# Patient Record
Sex: Female | Born: 1965 | Race: White | Hispanic: No | Smoking: Current some day smoker
Health system: Southern US, Community
[De-identification: ages and names within clinical notes are randomized; demographics above are authoritative.]

## PROBLEM LIST (undated history)

## (undated) DIAGNOSIS — F329 Major depressive disorder, single episode, unspecified: Secondary | ICD-10-CM

## (undated) DIAGNOSIS — E079 Disorder of thyroid, unspecified: Secondary | ICD-10-CM

## (undated) DIAGNOSIS — T7840XA Allergy, unspecified, initial encounter: Secondary | ICD-10-CM

## (undated) DIAGNOSIS — K219 Gastro-esophageal reflux disease without esophagitis: Secondary | ICD-10-CM

## (undated) DIAGNOSIS — C439 Malignant melanoma of skin, unspecified: Secondary | ICD-10-CM

## (undated) DIAGNOSIS — I499 Cardiac arrhythmia, unspecified: Secondary | ICD-10-CM

## (undated) DIAGNOSIS — E785 Hyperlipidemia, unspecified: Secondary | ICD-10-CM

## (undated) DIAGNOSIS — R011 Cardiac murmur, unspecified: Secondary | ICD-10-CM

## (undated) DIAGNOSIS — F32A Depression, unspecified: Secondary | ICD-10-CM

## (undated) DIAGNOSIS — C801 Malignant (primary) neoplasm, unspecified: Secondary | ICD-10-CM

## (undated) DIAGNOSIS — G43909 Migraine, unspecified, not intractable, without status migrainosus: Secondary | ICD-10-CM

## (undated) HISTORY — DX: Malignant melanoma of skin, unspecified: C43.9

## (undated) HISTORY — PX: SPLENECTOMY: SUR1306

## (undated) HISTORY — DX: Migraine, unspecified, not intractable, without status migrainosus: G43.909

## (undated) HISTORY — DX: Cardiac arrhythmia, unspecified: I49.9

## (undated) HISTORY — DX: Malignant (primary) neoplasm, unspecified: C80.1

## (undated) HISTORY — PX: CARDIAC ELECTROPHYSIOLOGY STUDY AND ABLATION: SHX1294

## (undated) HISTORY — DX: Gastro-esophageal reflux disease without esophagitis: K21.9

## (undated) HISTORY — DX: Cardiac murmur, unspecified: R01.1

## (undated) HISTORY — PX: BACK SURGERY: SHX140

## (undated) HISTORY — DX: Depression, unspecified: F32.A

## (undated) HISTORY — DX: Allergy, unspecified, initial encounter: T78.40XA

## (undated) HISTORY — DX: Major depressive disorder, single episode, unspecified: F32.9

## (undated) HISTORY — DX: Disorder of thyroid, unspecified: E07.9

## (undated) HISTORY — PX: LUMBAR LAMINECTOMY: SHX95

## (undated) HISTORY — DX: Hyperlipidemia, unspecified: E78.5

---
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
## 1995-03-03 HISTORY — PX: CERVICAL BIOPSY  W/ LOOP ELECTRODE EXCISION: SUR135

## 2001-03-02 HISTORY — PX: ABDOMINAL HYSTERECTOMY: SHX81

## 2005-03-02 HISTORY — PX: BREAST BIOPSY: SHX20

## 2011-01-19 ENCOUNTER — Emergency Department: Payer: Self-pay | Admitting: *Deleted

## 2011-03-03 HISTORY — PX: TONSILECTOMY, ADENOIDECTOMY, BILATERAL MYRINGOTOMY AND TUBES: SHX2538

## 2011-04-07 DIAGNOSIS — J3501 Chronic tonsillitis: Secondary | ICD-10-CM | POA: Diagnosis not present

## 2011-04-23 DIAGNOSIS — Z01818 Encounter for other preprocedural examination: Secondary | ICD-10-CM | POA: Diagnosis not present

## 2011-04-29 DIAGNOSIS — F329 Major depressive disorder, single episode, unspecified: Secondary | ICD-10-CM | POA: Diagnosis not present

## 2011-04-29 DIAGNOSIS — Z0389 Encounter for observation for other suspected diseases and conditions ruled out: Secondary | ICD-10-CM | POA: Diagnosis not present

## 2011-04-29 DIAGNOSIS — J3501 Chronic tonsillitis: Secondary | ICD-10-CM | POA: Diagnosis not present

## 2011-04-29 DIAGNOSIS — Z8541 Personal history of malignant neoplasm of cervix uteri: Secondary | ICD-10-CM | POA: Diagnosis not present

## 2011-04-29 DIAGNOSIS — I359 Nonrheumatic aortic valve disorder, unspecified: Secondary | ICD-10-CM | POA: Diagnosis not present

## 2011-04-29 DIAGNOSIS — Z79899 Other long term (current) drug therapy: Secondary | ICD-10-CM | POA: Diagnosis not present

## 2011-04-29 DIAGNOSIS — K219 Gastro-esophageal reflux disease without esophagitis: Secondary | ICD-10-CM | POA: Diagnosis not present

## 2011-04-29 DIAGNOSIS — C8190 Hodgkin lymphoma, unspecified, unspecified site: Secondary | ICD-10-CM | POA: Diagnosis not present

## 2011-04-29 DIAGNOSIS — J351 Hypertrophy of tonsils: Secondary | ICD-10-CM | POA: Diagnosis not present

## 2011-04-29 DIAGNOSIS — I059 Rheumatic mitral valve disease, unspecified: Secondary | ICD-10-CM | POA: Diagnosis not present

## 2011-04-29 DIAGNOSIS — Z9079 Acquired absence of other genital organ(s): Secondary | ICD-10-CM | POA: Diagnosis not present

## 2011-04-29 DIAGNOSIS — E039 Hypothyroidism, unspecified: Secondary | ICD-10-CM | POA: Diagnosis not present

## 2011-04-29 DIAGNOSIS — F17200 Nicotine dependence, unspecified, uncomplicated: Secondary | ICD-10-CM | POA: Diagnosis not present

## 2011-04-29 DIAGNOSIS — Z7982 Long term (current) use of aspirin: Secondary | ICD-10-CM | POA: Diagnosis not present

## 2011-05-28 DIAGNOSIS — J3501 Chronic tonsillitis: Secondary | ICD-10-CM | POA: Diagnosis not present

## 2011-06-23 DIAGNOSIS — R000 Tachycardia, unspecified: Secondary | ICD-10-CM | POA: Diagnosis not present

## 2011-06-23 DIAGNOSIS — M79609 Pain in unspecified limb: Secondary | ICD-10-CM | POA: Diagnosis not present

## 2011-06-23 DIAGNOSIS — R0609 Other forms of dyspnea: Secondary | ICD-10-CM | POA: Diagnosis not present

## 2011-06-23 DIAGNOSIS — R0989 Other specified symptoms and signs involving the circulatory and respiratory systems: Secondary | ICD-10-CM | POA: Diagnosis not present

## 2011-07-16 DIAGNOSIS — F17200 Nicotine dependence, unspecified, uncomplicated: Secondary | ICD-10-CM | POA: Diagnosis not present

## 2011-07-16 DIAGNOSIS — K219 Gastro-esophageal reflux disease without esophagitis: Secondary | ICD-10-CM | POA: Diagnosis not present

## 2011-07-16 DIAGNOSIS — C8118 Nodular sclerosis classical Hodgkin lymphoma, lymph nodes of multiple sites: Secondary | ICD-10-CM | POA: Diagnosis not present

## 2011-07-16 DIAGNOSIS — Z8541 Personal history of malignant neoplasm of cervix uteri: Secondary | ICD-10-CM | POA: Diagnosis not present

## 2011-07-16 DIAGNOSIS — F329 Major depressive disorder, single episode, unspecified: Secondary | ICD-10-CM | POA: Diagnosis not present

## 2011-07-16 DIAGNOSIS — E039 Hypothyroidism, unspecified: Secondary | ICD-10-CM | POA: Diagnosis not present

## 2011-07-16 DIAGNOSIS — Z9079 Acquired absence of other genital organ(s): Secondary | ICD-10-CM | POA: Diagnosis not present

## 2011-07-16 DIAGNOSIS — R0602 Shortness of breath: Secondary | ICD-10-CM | POA: Diagnosis not present

## 2011-07-16 DIAGNOSIS — I359 Nonrheumatic aortic valve disorder, unspecified: Secondary | ICD-10-CM | POA: Diagnosis not present

## 2011-07-16 DIAGNOSIS — Z79899 Other long term (current) drug therapy: Secondary | ICD-10-CM | POA: Diagnosis not present

## 2011-07-16 DIAGNOSIS — Z7982 Long term (current) use of aspirin: Secondary | ICD-10-CM | POA: Diagnosis not present

## 2011-07-16 DIAGNOSIS — Z5189 Encounter for other specified aftercare: Secondary | ICD-10-CM | POA: Diagnosis not present

## 2011-07-16 DIAGNOSIS — I059 Rheumatic mitral valve disease, unspecified: Secondary | ICD-10-CM | POA: Diagnosis not present

## 2011-07-16 DIAGNOSIS — C8190 Hodgkin lymphoma, unspecified, unspecified site: Secondary | ICD-10-CM | POA: Diagnosis not present

## 2011-08-31 DIAGNOSIS — M7660 Achilles tendinitis, unspecified leg: Secondary | ICD-10-CM | POA: Diagnosis not present

## 2011-08-31 DIAGNOSIS — M545 Low back pain: Secondary | ICD-10-CM | POA: Diagnosis not present

## 2011-08-31 DIAGNOSIS — M25579 Pain in unspecified ankle and joints of unspecified foot: Secondary | ICD-10-CM | POA: Diagnosis not present

## 2011-08-31 DIAGNOSIS — M216X9 Other acquired deformities of unspecified foot: Secondary | ICD-10-CM | POA: Diagnosis not present

## 2011-08-31 DIAGNOSIS — M21969 Unspecified acquired deformity of unspecified lower leg: Secondary | ICD-10-CM | POA: Insufficient documentation

## 2011-11-25 DIAGNOSIS — R94131 Abnormal electromyogram [EMG]: Secondary | ICD-10-CM | POA: Diagnosis not present

## 2011-11-25 DIAGNOSIS — M216X9 Other acquired deformities of unspecified foot: Secondary | ICD-10-CM | POA: Diagnosis not present

## 2011-11-25 DIAGNOSIS — M549 Dorsalgia, unspecified: Secondary | ICD-10-CM | POA: Diagnosis not present

## 2011-11-25 DIAGNOSIS — Z923 Personal history of irradiation: Secondary | ICD-10-CM | POA: Diagnosis not present

## 2011-11-25 DIAGNOSIS — Z9889 Other specified postprocedural states: Secondary | ICD-10-CM | POA: Diagnosis not present

## 2011-11-25 DIAGNOSIS — Z87898 Personal history of other specified conditions: Secondary | ICD-10-CM | POA: Diagnosis not present

## 2011-12-21 DIAGNOSIS — M2140 Flat foot [pes planus] (acquired), unspecified foot: Secondary | ICD-10-CM | POA: Diagnosis not present

## 2011-12-21 DIAGNOSIS — M84469D Pathological fracture, unspecified tibia and fibula, subsequent encounter for fracture with routine healing: Secondary | ICD-10-CM | POA: Diagnosis not present

## 2011-12-21 DIAGNOSIS — M7660 Achilles tendinitis, unspecified leg: Secondary | ICD-10-CM | POA: Diagnosis not present

## 2011-12-21 DIAGNOSIS — M216X9 Other acquired deformities of unspecified foot: Secondary | ICD-10-CM | POA: Diagnosis not present

## 2011-12-22 DIAGNOSIS — M216X9 Other acquired deformities of unspecified foot: Secondary | ICD-10-CM | POA: Diagnosis not present

## 2011-12-22 DIAGNOSIS — IMO0002 Reserved for concepts with insufficient information to code with codable children: Secondary | ICD-10-CM | POA: Diagnosis not present

## 2011-12-22 DIAGNOSIS — M545 Low back pain: Secondary | ICD-10-CM | POA: Diagnosis not present

## 2012-01-18 DIAGNOSIS — M5126 Other intervertebral disc displacement, lumbar region: Secondary | ICD-10-CM | POA: Diagnosis not present

## 2012-01-18 DIAGNOSIS — IMO0002 Reserved for concepts with insufficient information to code with codable children: Secondary | ICD-10-CM | POA: Diagnosis not present

## 2012-01-18 DIAGNOSIS — M48061 Spinal stenosis, lumbar region without neurogenic claudication: Secondary | ICD-10-CM | POA: Diagnosis not present

## 2012-01-18 DIAGNOSIS — Z79899 Other long term (current) drug therapy: Secondary | ICD-10-CM | POA: Diagnosis not present

## 2012-01-25 DIAGNOSIS — M216X9 Other acquired deformities of unspecified foot: Secondary | ICD-10-CM | POA: Diagnosis not present

## 2012-01-25 DIAGNOSIS — M545 Low back pain: Secondary | ICD-10-CM | POA: Diagnosis not present

## 2012-02-22 DIAGNOSIS — D1801 Hemangioma of skin and subcutaneous tissue: Secondary | ICD-10-CM | POA: Diagnosis not present

## 2012-02-22 DIAGNOSIS — D485 Neoplasm of uncertain behavior of skin: Secondary | ICD-10-CM | POA: Diagnosis not present

## 2012-04-04 DIAGNOSIS — C4441 Basal cell carcinoma of skin of scalp and neck: Secondary | ICD-10-CM | POA: Diagnosis not present

## 2012-04-04 DIAGNOSIS — D235 Other benign neoplasm of skin of trunk: Secondary | ICD-10-CM | POA: Diagnosis not present

## 2012-05-18 ENCOUNTER — Ambulatory Visit (INDEPENDENT_AMBULATORY_CARE_PROVIDER_SITE_OTHER): Payer: Medicare Other | Admitting: Internal Medicine

## 2012-05-18 ENCOUNTER — Encounter: Payer: Self-pay | Admitting: Internal Medicine

## 2012-05-18 VITALS — BP 128/84 | HR 104 | Temp 98.1°F | Ht 64.5 in | Wt 205.0 lb

## 2012-05-18 DIAGNOSIS — I7389 Other specified peripheral vascular diseases: Secondary | ICD-10-CM

## 2012-05-18 DIAGNOSIS — K219 Gastro-esophageal reflux disease without esophagitis: Secondary | ICD-10-CM | POA: Insufficient documentation

## 2012-05-18 DIAGNOSIS — Z8571 Personal history of Hodgkin lymphoma: Secondary | ICD-10-CM

## 2012-05-18 DIAGNOSIS — R230 Cyanosis: Secondary | ICD-10-CM

## 2012-05-18 DIAGNOSIS — M545 Low back pain, unspecified: Secondary | ICD-10-CM

## 2012-05-18 DIAGNOSIS — E039 Hypothyroidism, unspecified: Secondary | ICD-10-CM

## 2012-05-18 DIAGNOSIS — F17200 Nicotine dependence, unspecified, uncomplicated: Secondary | ICD-10-CM

## 2012-05-18 DIAGNOSIS — E785 Hyperlipidemia, unspecified: Secondary | ICD-10-CM | POA: Diagnosis not present

## 2012-05-18 DIAGNOSIS — Z0000 Encounter for general adult medical examination without abnormal findings: Secondary | ICD-10-CM | POA: Diagnosis not present

## 2012-05-18 DIAGNOSIS — D649 Anemia, unspecified: Secondary | ICD-10-CM

## 2012-05-18 DIAGNOSIS — Z720 Tobacco use: Secondary | ICD-10-CM

## 2012-05-18 LAB — COMPREHENSIVE METABOLIC PANEL
Albumin: 4 g/dL (ref 3.5–5.2)
Alkaline Phosphatase: 88 U/L (ref 39–117)
BUN: 17 mg/dL (ref 6–23)
CO2: 26 mEq/L (ref 19–32)
Calcium: 9.9 mg/dL (ref 8.4–10.5)
Chloride: 102 mEq/L (ref 96–112)
GFR: 102.51 mL/min (ref >60.00)
Glucose, Bld: 96 mg/dL (ref 70–99)
Potassium: 4.3 mEq/L (ref 3.5–5.1)
Sodium: 137 mEq/L (ref 135–145)
Total Protein: 7.6 g/dL (ref 6.0–8.3)

## 2012-05-18 LAB — T4, FREE: Free T4: 0.99 ng/dL (ref 0.60–1.60)

## 2012-05-18 LAB — CBC WITH DIFFERENTIAL/PLATELET
Basophils Relative: 0.8 % (ref 0.0–3.0)
Eosinophils Absolute: 0.1 10*3/uL (ref 0.0–0.7)
Eosinophils Relative: 1.3 % (ref 0.0–5.0)
HCT: 38.1 % (ref 36.0–46.0)
Hemoglobin: 12.6 g/dL (ref 12.0–15.0)
MCHC: 33 g/dL (ref 30.0–36.0)
MCV: 95.9 fl (ref 78.0–100.0)
Monocytes Absolute: 0.8 10*3/uL (ref 0.1–1.0)
Neutro Abs: 5.5 10*3/uL (ref 1.4–7.7)
Neutrophils Relative %: 52.5 % (ref 43.0–77.0)
RBC: 3.97 Mil/uL (ref 3.87–5.11)
WBC: 10.5 10*3/uL (ref 4.5–10.5)

## 2012-05-18 LAB — LDL CHOLESTEROL, DIRECT: Direct LDL: 176.3 mg/dL

## 2012-05-18 LAB — TSH: TSH: 1.58 u[IU]/mL (ref 0.35–5.50)

## 2012-05-18 NOTE — Assessment & Plan Note (Addendum)
Hx of Hodgkin's Lymphoma. Treated with chemo, xrt, splenectomy. Will request previous records from [place]. Pt follows at [facility]. Over spent >50% of which was face to face contact with pt discussing medical history and plan of ongoing care.

## 2012-05-18 NOTE — Assessment & Plan Note (Addendum)
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 Will request records on previous imaging. Pt has follow up at [facility] tomorrow. Continue Cymbalta.

## 2012-05-18 NOTE — Progress Notes (Signed)
Subjective:    Patient ID: Teresa [name], female    DOB: 11-21-1965, 47 y.o.   MRN: [id number]  HPI 47 year old female with history of Hodgkin's lymphoma, hypothyroidism, chronic low back pain presents to establish care. She reports that she was treated as a child for Hodgkin's lymphoma with chemotherapy, radiation therapy to her chest, and splenectomy. She is followed at the [facility]. She has chronic problems secondary to radiation exposure including her hypothyroidism and chronic pain in her lumbar spine. She reports minimal improvement with Cymbalta. Pain is described as aching which waxes and wanes. It is made worse by cold weather. She plans to followup at the [facility] pain clinic tomorrow. She is also being evaluated by a neurologist at [facility] for further evaluation of chronic pain in her legs and foot drop bilaterally.   Outpatient Encounter Prescriptions as of 05/18/2012  Medication Sig Dispense Refill  . aspirin 81 MG tablet Take 81 mg by mouth daily.      . DULoxetine (CYMBALTA) 60 MG capsule Take 60 mg by mouth daily.      Marland Kitchen esomeprazole (NEXIUM) 40 MG capsule Take 40 mg by mouth daily before breakfast.      . SYNTHROID 125 MCG tablet        No facility-administered encounter medications on file as of 05/18/2012.   BP 128/84  Pulse 104  Temp(Src) 98.1 F (36.7 C) (Oral)  Ht 5' 4.5" (1.638 m)  Wt 205 lb (92.987 kg)  BMI 34.66 kg/m2  SpO2 98%  LMP 03/20/2001  Review of Systems  Constitutional: Positive for fatigue. Negative for fever, chills, appetite change and unexpected weight change.  HENT: Negative for ear pain, congestion, sore throat, trouble swallowing, neck pain, voice change and sinus pressure.   Eyes: Negative for visual disturbance.  Respiratory: Negative for cough, shortness of breath, wheezing and stridor.   Cardiovascular: Negative for chest pain, palpitations and leg swelling.  Gastrointestinal: Negative for nausea, vomiting, abdominal pain,  diarrhea, constipation, blood in stool, abdominal distention and anal bleeding.  Genitourinary: Negative for dysuria and flank pain.  Musculoskeletal: Positive for myalgias, arthralgias and gait problem.  Skin: Negative for color change and rash.  Neurological: Positive for weakness. Negative for dizziness and headaches.  Hematological: Negative for adenopathy. Does not bruise/bleed easily.  Psychiatric/Behavioral: Negative for suicidal ideas, sleep disturbance and dysphoric mood. The patient is not nervous/anxious.        Objective:   Physical Exam  Constitutional: She is oriented to person, place, and time. She appears well-developed and well-nourished. No distress.  HENT:  Head: Normocephalic and atraumatic.  Right Ear: External ear normal.  Left Ear: External ear normal.  Nose: Nose normal.  Mouth/Throat: Oropharynx is clear and moist. No oropharyngeal exudate.  Eyes: Conjunctivae are normal. Pupils are equal, round, and reactive to light. Right eye exhibits no discharge. Left eye exhibits no discharge. No scleral icterus.  Neck: Normal range of motion. Neck supple. No tracheal deviation present. No thyromegaly present.  Cardiovascular: Normal rate, regular rhythm, normal heart sounds and intact distal pulses.  Exam reveals no gallop and no friction rub.   No murmur heard. Pulmonary/Chest: Effort normal and breath sounds normal. No respiratory distress. She has no wheezes. She has no rales. She exhibits no tenderness.  Musculoskeletal: Normal range of motion. She exhibits no edema and no tenderness.  Lymphadenopathy:    She has no cervical adenopathy.  Neurological: She is alert and oriented to person, place, and time. No cranial nerve  deficit or sensory deficit. She exhibits normal muscle tone. Gait (gait slowed secondary to low back pain and bilateral foot drop) abnormal. Coordination normal.  Skin: Skin is warm and dry. No rash noted. She is not diaphoretic. There is cyanosis  (bilateral distal lower extremities). No erythema. No pallor.  Psychiatric: She has a normal mood and affect. Her behavior is normal. Judgment and thought content normal.          Assessment & Plan:

## 2012-05-18 NOTE — Assessment & Plan Note (Signed)
Symptoms well controlled on nexium. Will continue. 

## 2012-05-18 NOTE — Assessment & Plan Note (Signed)
  Lab Results  Component Value Date   CHOL 262* 05/18/2012   HDL 55.80 05/18/2012   LDLDIRECT 176.3 05/18/2012   TRIG 125.0 05/18/2012   CHOLHDL 5 05/18/2012   Lipids elevated. Pt is at high risk CAD given h/o XRT to chest and tobacco abuse. Recommend starting Atorvastatin 10mg  daily. Repeat lipids in 1 month.

## 2012-05-18 NOTE — Assessment & Plan Note (Signed)
Distal LE cyanotic and cool to touch. Pt reports neurologist at [facility] is planning for vascular studies for further evaluation.

## 2012-05-18 NOTE — Assessment & Plan Note (Signed)
Lab Results  Component Value Date   TSH 1.58 05/18/2012   TSH normal today. Continue Levothyroxine. Follow up 1 month.

## 2012-05-19 DIAGNOSIS — M545 Low back pain, unspecified: Secondary | ICD-10-CM | POA: Diagnosis not present

## 2012-05-19 DIAGNOSIS — M216X9 Other acquired deformities of unspecified foot: Secondary | ICD-10-CM | POA: Diagnosis not present

## 2012-05-19 DIAGNOSIS — M961 Postlaminectomy syndrome, not elsewhere classified: Secondary | ICD-10-CM | POA: Diagnosis not present

## 2012-05-19 DIAGNOSIS — G894 Chronic pain syndrome: Secondary | ICD-10-CM | POA: Diagnosis not present

## 2012-05-22 ENCOUNTER — Encounter: Payer: Self-pay | Admitting: Internal Medicine

## 2012-05-23 ENCOUNTER — Telehealth: Payer: Self-pay | Admitting: Internal Medicine

## 2012-05-23 MED ORDER — ATORVASTATIN CALCIUM 10 MG PO TABS: 10.0000 mg | ORAL_TABLET | Freq: Every day | ORAL | Status: DC

## 2012-05-23 NOTE — Telephone Encounter (Signed)
Patient informed of lab results, please refer to lab encounter for further details. Rx faxed to pharmacy also.

## 2012-05-23 NOTE — Telephone Encounter (Signed)
Patient calling back about her test results. ?

## 2012-06-27 ENCOUNTER — Other Ambulatory Visit (HOSPITAL_COMMUNITY)
Admission: RE | Admit: 2012-06-27 | Discharge: 2012-06-27 | Disposition: A | Discharge status: Home / Self Care | Payer: Medicare Other | Source: Ambulatory Visit | Attending: Internal Medicine | Admitting: Internal Medicine

## 2012-06-27 ENCOUNTER — Ambulatory Visit (INDEPENDENT_AMBULATORY_CARE_PROVIDER_SITE_OTHER): Payer: Medicare Other | Admitting: Internal Medicine

## 2012-06-27 ENCOUNTER — Encounter: Payer: Self-pay | Admitting: Internal Medicine

## 2012-06-27 VITALS — BP 144/98 | HR 94 | Temp 98.3°F | Ht 65.0 in | Wt 209.0 lb

## 2012-06-27 DIAGNOSIS — E785 Hyperlipidemia, unspecified: Secondary | ICD-10-CM | POA: Diagnosis not present

## 2012-06-27 DIAGNOSIS — I7389 Other specified peripheral vascular diseases: Secondary | ICD-10-CM

## 2012-06-27 DIAGNOSIS — Z01419 Encounter for gynecological examination (general) (routine) without abnormal findings: Secondary | ICD-10-CM | POA: Insufficient documentation

## 2012-06-27 DIAGNOSIS — Z1211 Encounter for screening for malignant neoplasm of colon: Secondary | ICD-10-CM

## 2012-06-27 DIAGNOSIS — K219 Gastro-esophageal reflux disease without esophagitis: Secondary | ICD-10-CM

## 2012-06-27 DIAGNOSIS — M545 Low back pain: Secondary | ICD-10-CM

## 2012-06-27 DIAGNOSIS — Z1151 Encounter for screening for human papillomavirus (HPV): Secondary | ICD-10-CM | POA: Diagnosis not present

## 2012-06-27 DIAGNOSIS — R87810 Cervical high risk human papillomavirus (HPV) DNA test positive: Secondary | ICD-10-CM | POA: Diagnosis not present

## 2012-06-27 DIAGNOSIS — Z720 Tobacco use: Secondary | ICD-10-CM

## 2012-06-27 DIAGNOSIS — F17200 Nicotine dependence, unspecified, uncomplicated: Secondary | ICD-10-CM

## 2012-06-27 DIAGNOSIS — B353 Tinea pedis: Secondary | ICD-10-CM | POA: Diagnosis not present

## 2012-06-27 DIAGNOSIS — Z0000 Encounter for general adult medical examination without abnormal findings: Secondary | ICD-10-CM | POA: Diagnosis not present

## 2012-06-27 DIAGNOSIS — R230 Cyanosis: Secondary | ICD-10-CM

## 2012-06-27 DIAGNOSIS — E039 Hypothyroidism, unspecified: Secondary | ICD-10-CM

## 2012-06-27 MED ORDER — FLUCONAZOLE 150 MG PO TABS: 150.0000 mg | ORAL_TABLET | ORAL | Status: DC

## 2012-06-27 NOTE — Assessment & Plan Note (Signed)
General medical exam normal today except as noted. Patient will followup at [facility] long-term survivors clinic. We discussed setting up MRI of the breasts through [facility], as it is not available locally. Will set up colonoscopy given family history of colon cancer. Encouraged smoking cessation. Encouraged healthy diet, low in saturated fat and high in fiber. Encouraged increasing physical activity with goal of 40 minutes of activity 3 times per week. We'll plan to recheck lipids in October 2014.

## 2012-06-27 NOTE — Assessment & Plan Note (Signed)
Exam is consistent with tinea pedis. No improvement with topical Lotrimin. Will try oral fluconazole 150 mg weekly x4 weeks. Patient will call if no improvement.

## 2012-06-27 NOTE — Progress Notes (Signed)
Subjective:    Patient ID: Teresa [name], female    DOB: 08-Jan-1966, 47 y.o.   MRN: [id number]  HPI 47 year old female with history of Hodgkin's lymphoma status post chemotherapy and radiation therapy, hypothyroidism, chronic pain presents for annual exam. In the interim since her last visit, she reports that she was evaluated at the [facility] pain clinic and told that there is nothing additional that can offer. She continues on Cymbalta. She continues to have chronic pain.  At her last visit, she was noted to have elevated cholesterol. We recommended that she start atorvastatin. She reports she was concerned about side effects from this medication and decided not to start it. She is trying to work on diet and exercise to lower her cholesterol.  She continues to be concerned about swelling in her lower extremities which is persistent from day-to-day. She also notes occasional purplish or bluish discoloration of her feet. She has not yet had a vascular evaluation. She has not had any ulcerations on her feet.  She is also concerned about fungal infection in between her toes particularly on her left foot. She describes this is occasionally painful. She has been trying over-the-counter Lotrimin powder and cream with no improvement.  In regards to health maintenance, she had MRI of her breasts in 2013 at [facility] which was normal. She denies any new lesions noted in her breast but does note that her left breast has some reddish discoloration medially. This has been present for unknown amount of time.  She also notes a family history of colon cancer in her maternal grandfather. Her mother was known to have colon polyps. She questions when she is due for colonoscopy.    Outpatient Encounter Prescriptions as of 06/27/2012  Medication Sig Dispense Refill  . aspirin 81 MG tablet Take 81 mg by mouth daily.      . diphenhydrAMINE (BENADRYL CHILDRENS ALLERGY) 12.5 MG/5ML liquid Take by mouth 4 (four) times  daily as needed.      . DULoxetine (CYMBALTA) 60 MG capsule Take 60 mg by mouth daily.      Marland Kitchen esomeprazole (NEXIUM) 40 MG capsule Take 40 mg by mouth daily before breakfast.      . SYNTHROID 125 MCG tablet       . [DISCONTINUED] atorvastatin (LIPITOR) 10 MG tablet Take 1 tablet (10 mg total) by mouth daily.  30 tablet  5  . fluconazole (DIFLUCAN) 150 MG tablet Take 1 tablet (150 mg total) by mouth once a week.  4 tablet  0  . [DISCONTINUED] aspirin EC 81 MG tablet Take by mouth. Take 81 mg by mouth daily.       No facility-administered encounter medications on file as of 06/27/2012.   BP 144/98  Pulse 94  Temp(Src) 98.3 F (36.8 C) (Oral)  Ht 5\' 5"  (1.651 m)  Wt 209 lb (94.802 kg)  BMI 34.78 kg/m2  SpO2 97%  LMP 03/20/2001  Review of Systems  Constitutional: Negative for fever, chills, appetite change, fatigue and unexpected weight change.  HENT: Negative for ear pain, congestion, sore throat, trouble swallowing, neck pain, voice change and sinus pressure.   Eyes: Negative for visual disturbance.  Respiratory: Negative for cough, shortness of breath, wheezing and stridor.   Cardiovascular: Positive for leg swelling. Negative for chest pain and palpitations.  Gastrointestinal: Negative for nausea, vomiting, abdominal pain, diarrhea, constipation, blood in stool, abdominal distention and anal bleeding.  Genitourinary: Negative for dysuria and flank pain.  Musculoskeletal: Positive for myalgias, back pain  and arthralgias. Negative for gait problem.  Skin: Positive for color change and rash (maceration in between toes left foot with underlying erythema).  Neurological: Negative for dizziness and headaches.  Hematological: Negative for adenopathy. Does not bruise/bleed easily.  Psychiatric/Behavioral: Negative for suicidal ideas, sleep disturbance and dysphoric mood. The patient is not nervous/anxious.        Objective:   Physical Exam  Constitutional: She is oriented to person,  place, and time. She appears well-developed and well-nourished. No distress.  HENT:  Head: Normocephalic and atraumatic.  Right Ear: External ear normal.  Left Ear: External ear normal.  Nose: Nose normal.  Mouth/Throat: Oropharynx is clear and moist. No oropharyngeal exudate.  Eyes: Conjunctivae are normal. Pupils are equal, round, and reactive to light. Right eye exhibits no discharge. Left eye exhibits no discharge. No scleral icterus.  Neck: Normal range of motion. Neck supple. No tracheal deviation present. No thyromegaly present.  Cardiovascular: Normal rate, regular rhythm, normal heart sounds and intact distal pulses.  Exam reveals no gallop and no friction rub.   No murmur heard. Pulmonary/Chest: Effort normal and breath sounds normal. No accessory muscle usage. Not tachypneic. No respiratory distress. She has no decreased breath sounds. She has no wheezes. She has no rhonchi. She has no rales. She exhibits no tenderness. Right breast exhibits no inverted nipple, no mass, no nipple discharge, no skin change and no tenderness. Left breast exhibits skin change. Left breast exhibits no inverted nipple, no mass, no nipple discharge and no tenderness.    Abdominal: Soft. Bowel sounds are normal. She exhibits no distension and no mass. There is no tenderness. There is no rebound and no guarding.  Genitourinary: No breast swelling, tenderness, discharge or bleeding. Pelvic exam was performed with patient supine. There is no rash, tenderness or lesion on the right labia. There is no rash, tenderness or lesion on the left labia. Left adnexum displays no mass. There is erythema around the vagina. No tenderness around the vagina. No vaginal discharge found.  Uterus and cervix surgically absent  Musculoskeletal: Normal range of motion. She exhibits no edema and no tenderness.  Lymphadenopathy:    She has no cervical adenopathy.  Neurological: She is alert and oriented to person, place, and time. She  displays atrophy (foot drop bilateral). No cranial nerve deficit. She exhibits normal muscle tone. Coordination normal.  Skin: Skin is warm and dry. No rash noted. She is not diaphoretic. No erythema. No pallor.  Psychiatric: She has a normal mood and affect. Her behavior is normal. Judgment and thought content normal.          Assessment & Plan:

## 2012-06-27 NOTE — Assessment & Plan Note (Signed)
Symptomatically doing well. Recent TSH and free T4 normal. Will continue to monitor.

## 2012-06-27 NOTE — Assessment & Plan Note (Signed)
Encouraged smoking cessation 

## 2012-06-27 NOTE — Assessment & Plan Note (Signed)
Chronic low back pain on Cymbalta. Recently evaluated at [facility] pain clinic. Will request notes on this evaluation.

## 2012-06-27 NOTE — Assessment & Plan Note (Signed)
Symptoms recently poorly controlled despite use of Nexium. Will send testing for H. Pylori. If symptoms are persistent, we've discussed setting up EGD.

## 2012-06-27 NOTE — Assessment & Plan Note (Signed)
Intermittent cyanosis of lower extremities concerning for peripheral arterial disease. Will set up vascular evaluation with [facility].

## 2012-06-27 NOTE — Assessment & Plan Note (Signed)
Referral placed for colonoscopy. 

## 2012-06-27 NOTE — Assessment & Plan Note (Signed)
Patient with considerable risk for coronary artery disease including elevated cholesterol. Recommended starting atorvastatin 10 mg daily. Patient declined given concerns about safety at this medication. Discussed following a Mediterranean style diet and increasing exercise with a goal of 40 minutes 3 times per week. We'll plan to repeat cholesterol in 6 months, October 2014.

## 2012-06-29 ENCOUNTER — Encounter: Payer: Self-pay | Admitting: Emergency Medicine

## 2012-06-29 LAB — HM PAP SMEAR: HM Pap smear: POSITIVE

## 2012-07-01 ENCOUNTER — Telehealth: Payer: Self-pay | Admitting: *Deleted

## 2012-07-01 NOTE — Telephone Encounter (Signed)
Patient would like to be referred to [facility] and see Dr. Algis Downs ([place]).

## 2012-07-01 NOTE — Telephone Encounter (Signed)
Message copied by Theola Sequin on Fri Jul 01, 2012  4:36 PM ------      Message from: Ronna Polio A      Created: Fri Jul 01, 2012 12:45 PM       Please make sure pt received this message            PAP smear showed atypical cells of undetermined significance. 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 Given these findings, the next step will be evaluation with gynecology for colposcopy.  Please let us know which gynecologist you would like to see. ------

## 2012-07-11 ENCOUNTER — Other Ambulatory Visit: Payer: Self-pay | Admitting: Internal Medicine

## 2012-07-11 DIAGNOSIS — K219 Gastro-esophageal reflux disease without esophagitis: Secondary | ICD-10-CM | POA: Diagnosis not present

## 2012-07-12 DIAGNOSIS — I70219 Atherosclerosis of native arteries of extremities with intermittent claudication, unspecified extremity: Secondary | ICD-10-CM | POA: Diagnosis not present

## 2012-07-12 DIAGNOSIS — E785 Hyperlipidemia, unspecified: Secondary | ICD-10-CM | POA: Diagnosis not present

## 2012-07-12 DIAGNOSIS — M7989 Other specified soft tissue disorders: Secondary | ICD-10-CM | POA: Diagnosis not present

## 2012-07-12 DIAGNOSIS — M79609 Pain in unspecified limb: Secondary | ICD-10-CM | POA: Diagnosis not present

## 2012-07-12 DIAGNOSIS — R87811 Vaginal high risk human papillomavirus (HPV) DNA test positive: Secondary | ICD-10-CM | POA: Diagnosis not present

## 2012-07-14 DIAGNOSIS — R110 Nausea: Secondary | ICD-10-CM | POA: Diagnosis not present

## 2012-07-14 DIAGNOSIS — H538 Other visual disturbances: Secondary | ICD-10-CM | POA: Diagnosis not present

## 2012-07-14 DIAGNOSIS — S1093XA Contusion of unspecified part of neck, initial encounter: Secondary | ICD-10-CM | POA: Diagnosis not present

## 2012-07-14 DIAGNOSIS — Z043 Encounter for examination and observation following other accident: Secondary | ICD-10-CM | POA: Diagnosis not present

## 2012-07-14 DIAGNOSIS — C8190 Hodgkin lymphoma, unspecified, unspecified site: Secondary | ICD-10-CM | POA: Diagnosis not present

## 2012-07-14 DIAGNOSIS — S0990XA Unspecified injury of head, initial encounter: Secondary | ICD-10-CM | POA: Diagnosis not present

## 2012-07-14 DIAGNOSIS — F0781 Postconcussional syndrome: Secondary | ICD-10-CM | POA: Diagnosis not present

## 2012-07-14 DIAGNOSIS — R51 Headache: Secondary | ICD-10-CM | POA: Diagnosis not present

## 2012-07-14 DIAGNOSIS — S0003XA Contusion of scalp, initial encounter: Secondary | ICD-10-CM | POA: Diagnosis not present

## 2012-07-14 DIAGNOSIS — W1809XA Striking against other object with subsequent fall, initial encounter: Secondary | ICD-10-CM | POA: Diagnosis not present

## 2012-07-14 DIAGNOSIS — R269 Unspecified abnormalities of gait and mobility: Secondary | ICD-10-CM | POA: Diagnosis not present

## 2012-07-14 DIAGNOSIS — W19XXXA Unspecified fall, initial encounter: Secondary | ICD-10-CM | POA: Diagnosis not present

## 2012-07-18 LAB — H. PYLORI BREATH TEST: H. pylori UBiT: NEGATIVE

## 2012-08-04 ENCOUNTER — Encounter: Payer: Self-pay | Admitting: Internal Medicine

## 2012-08-26 DIAGNOSIS — Z8371 Family history of colonic polyps: Secondary | ICD-10-CM | POA: Diagnosis not present

## 2012-08-26 DIAGNOSIS — K219 Gastro-esophageal reflux disease without esophagitis: Secondary | ICD-10-CM | POA: Diagnosis not present

## 2012-08-28 ENCOUNTER — Other Ambulatory Visit: Payer: Self-pay | Admitting: Internal Medicine

## 2012-08-30 ENCOUNTER — Telehealth: Payer: Self-pay | Admitting: Internal Medicine

## 2012-08-30 MED ORDER — DULOXETINE HCL 60 MG PO CPEP: 60.0000 mg | ORAL_CAPSULE | Freq: Every day | ORAL | Status: DC

## 2012-08-30 NOTE — Telephone Encounter (Signed)
Patient called stating the pharmacy has called [place] multiple times trying to refill her cymbalta. I do not see anything where they have tried to contact us. The patient has already taken her last one Sunday night. Can we please refill this ASAP?

## 2012-08-30 NOTE — Telephone Encounter (Signed)
Rx sent to pharmacy   

## 2012-08-30 NOTE — Telephone Encounter (Signed)
Fine to refill with 6 months refills

## 2012-09-12 ENCOUNTER — Ambulatory Visit: Payer: Self-pay | Admitting: Unknown Physician Specialty

## 2012-09-12 DIAGNOSIS — Z1211 Encounter for screening for malignant neoplasm of colon: Secondary | ICD-10-CM | POA: Diagnosis not present

## 2012-09-12 DIAGNOSIS — K5730 Diverticulosis of large intestine without perforation or abscess without bleeding: Secondary | ICD-10-CM | POA: Diagnosis not present

## 2012-09-12 DIAGNOSIS — Z87891 Personal history of nicotine dependence: Secondary | ICD-10-CM | POA: Diagnosis not present

## 2012-09-12 DIAGNOSIS — K2970 Gastritis, unspecified, without bleeding: Secondary | ICD-10-CM | POA: Diagnosis not present

## 2012-09-12 DIAGNOSIS — K648 Other hemorrhoids: Secondary | ICD-10-CM | POA: Diagnosis not present

## 2012-09-12 DIAGNOSIS — Z880 Allergy status to penicillin: Secondary | ICD-10-CM | POA: Diagnosis not present

## 2012-09-12 DIAGNOSIS — K2940 Chronic atrophic gastritis without bleeding: Secondary | ICD-10-CM | POA: Diagnosis not present

## 2012-09-12 DIAGNOSIS — E039 Hypothyroidism, unspecified: Secondary | ICD-10-CM | POA: Diagnosis not present

## 2012-09-12 DIAGNOSIS — Z79899 Other long term (current) drug therapy: Secondary | ICD-10-CM | POA: Diagnosis not present

## 2012-09-12 DIAGNOSIS — K620 Anal polyp: Secondary | ICD-10-CM | POA: Diagnosis not present

## 2012-09-12 DIAGNOSIS — Z87898 Personal history of other specified conditions: Secondary | ICD-10-CM | POA: Diagnosis not present

## 2012-09-12 DIAGNOSIS — Z83719 Family history of colon polyps, unspecified: Secondary | ICD-10-CM | POA: Diagnosis not present

## 2012-09-12 DIAGNOSIS — Z91040 Latex allergy status: Secondary | ICD-10-CM | POA: Diagnosis not present

## 2012-09-12 DIAGNOSIS — Z9089 Acquired absence of other organs: Secondary | ICD-10-CM | POA: Diagnosis not present

## 2012-09-12 DIAGNOSIS — R12 Heartburn: Secondary | ICD-10-CM | POA: Diagnosis not present

## 2012-09-12 DIAGNOSIS — D129 Benign neoplasm of anus and anal canal: Secondary | ICD-10-CM | POA: Diagnosis not present

## 2012-09-12 DIAGNOSIS — Z888 Allergy status to other drugs, medicaments and biological substances status: Secondary | ICD-10-CM | POA: Diagnosis not present

## 2012-09-12 DIAGNOSIS — Z800 Family history of malignant neoplasm of digestive organs: Secondary | ICD-10-CM | POA: Diagnosis not present

## 2012-09-12 DIAGNOSIS — Z885 Allergy status to narcotic agent status: Secondary | ICD-10-CM | POA: Diagnosis not present

## 2012-09-12 DIAGNOSIS — D126 Benign neoplasm of colon, unspecified: Secondary | ICD-10-CM | POA: Diagnosis not present

## 2012-09-12 DIAGNOSIS — Z8371 Family history of colonic polyps: Secondary | ICD-10-CM | POA: Diagnosis not present

## 2012-09-12 DIAGNOSIS — Z8049 Family history of malignant neoplasm of other genital organs: Secondary | ICD-10-CM | POA: Diagnosis not present

## 2012-09-12 DIAGNOSIS — E7800 Pure hypercholesterolemia, unspecified: Secondary | ICD-10-CM | POA: Diagnosis not present

## 2012-09-12 DIAGNOSIS — Z9109 Other allergy status, other than to drugs and biological substances: Secondary | ICD-10-CM | POA: Diagnosis not present

## 2012-09-12 DIAGNOSIS — K449 Diaphragmatic hernia without obstruction or gangrene: Secondary | ICD-10-CM | POA: Diagnosis not present

## 2012-09-14 LAB — PATHOLOGY REPORT

## 2012-09-29 ENCOUNTER — Encounter: Payer: Self-pay | Admitting: Internal Medicine

## 2012-09-29 ENCOUNTER — Ambulatory Visit (INDEPENDENT_AMBULATORY_CARE_PROVIDER_SITE_OTHER): Payer: Medicare Other | Admitting: Internal Medicine

## 2012-09-29 VITALS — BP 140/90 | HR 92 | Temp 98.5°F | Wt 210.0 lb

## 2012-09-29 DIAGNOSIS — M542 Cervicalgia: Secondary | ICD-10-CM

## 2012-09-29 DIAGNOSIS — F329 Major depressive disorder, single episode, unspecified: Secondary | ICD-10-CM | POA: Diagnosis not present

## 2012-09-29 DIAGNOSIS — Z23 Encounter for immunization: Secondary | ICD-10-CM

## 2012-09-29 DIAGNOSIS — R269 Unspecified abnormalities of gait and mobility: Secondary | ICD-10-CM | POA: Insufficient documentation

## 2012-09-29 DIAGNOSIS — M545 Low back pain: Secondary | ICD-10-CM | POA: Diagnosis not present

## 2012-09-29 DIAGNOSIS — Z8571 Personal history of Hodgkin lymphoma: Secondary | ICD-10-CM

## 2012-09-29 DIAGNOSIS — B353 Tinea pedis: Secondary | ICD-10-CM

## 2012-09-29 DIAGNOSIS — J45909 Unspecified asthma, uncomplicated: Secondary | ICD-10-CM

## 2012-09-29 DIAGNOSIS — K219 Gastro-esophageal reflux disease without esophagitis: Secondary | ICD-10-CM

## 2012-09-29 DIAGNOSIS — J4520 Mild intermittent asthma, uncomplicated: Secondary | ICD-10-CM

## 2012-09-29 MED ORDER — FLUCONAZOLE 150 MG PO TABS: 150.0000 mg | ORAL_TABLET | ORAL | Status: DC

## 2012-09-29 MED ORDER — ALBUTEROL SULFATE HFA 108 (90 BASE) MCG/ACT IN AERS: 2.0000 | INHALATION_SPRAY | Freq: Four times a day (QID) | RESPIRATORY_TRACT | Status: DC | PRN

## 2012-09-29 MED ORDER — PANTOPRAZOLE SODIUM 40 MG PO TBEC
40.0000 mg | DELAYED_RELEASE_TABLET | Freq: Every day | ORAL | Status: DC
Start: 2012-09-29 — End: 2013-01-29

## 2012-09-29 MED ORDER — ARIPIPRAZOLE 2 MG PO TABS: 2.0000 mg | ORAL_TABLET | Freq: Every day | ORAL | Status: DC

## 2012-09-29 NOTE — Assessment & Plan Note (Signed)
Symptoms of depressed mood have recently worsened with the anniversary of her grandmother's death. Offered support today. We'll continue Cymbalta and will add Abilify starting at 2 mg daily. Patient will call if any side effects noted with this medication. Otherwise followup in 4 weeks.

## 2012-09-29 NOTE — Assessment & Plan Note (Signed)
Recurrent tinea pedis. Will treat with Diflucan.

## 2012-09-29 NOTE — Assessment & Plan Note (Signed)
Patient with poor balance and instability after history of chemotherapy and radiation to her chest. She has mild bilateral lower extremity weakness and decreased proprioception most likely from chemotherapy. Will set up occupational and physical therapy. Will set up a local neurology evaluation. Note that recent EMG testing was performed at [facility] and results are pending.

## 2012-09-29 NOTE — Assessment & Plan Note (Signed)
Symptoms currently well-controlled with albuterol as needed. Will continue.

## 2012-09-29 NOTE — Assessment & Plan Note (Signed)
Chronic, severe acid reflux disease. Unable to afford Nexium. Will try pantoprazole. Patient will call if symptoms are not controlled on pantoprazole.

## 2012-09-29 NOTE — Progress Notes (Signed)
Subjective:    Patient ID: Teresa [name], female    DOB: [birth year], 47 y.o.   MRN: [id number]  HPI 47 year old female with history of Hodgkin's lymphoma status post chemotherapy, radiation therapy, and splenectomy presents for followup. She has several concerns today. First, she notes several recent falls because of gait instability and difficulty balancing. She has been using a rolling walker with some improvement. During her most recent fall on July 14, she fell twisting her head backward hitting her neck. Since that time, she has had posterior neck pain and bilateral arm numbness and tingling radiating down to her fingers bilaterally. She also notes some occasional weakness in her grip strength. She has not been evaluated for this.  She is also concerned about chronic pain in her lower back and weakness in her legs. She was told at the [facility] pain clinic that there was nothing more they could offer her. She has been on Cymbalta with poor control of her symptoms. She reports that she has fairly constant pain described as aching in her lower back. She finds it hard to complete normal activities of daily living. She would like to establish a local pain clinic.  Because of ongoing medical issues and given that it is the anniversary of her grandmother says she finds herself more frequently depressed. She has had minimal improvement with Cymbalta. She is not currently following with a psychiatrist. She would like to consider additional medications. No suicidal ideation.  Outpatient Encounter Prescriptions as of 09/29/2012  Medication Sig Dispense Refill  . aspirin 81 MG tablet Take 81 mg by mouth daily.      . diphenhydrAMINE (BENADRYL CHILDRENS ALLERGY) 12.5 MG/5ML liquid Take by mouth 4 (four) times daily as needed.      . DULoxetine (CYMBALTA) 60 MG capsule Take 1 capsule (60 mg total) by mouth daily.  30 capsule  5  . fluconazole (DIFLUCAN) 150 MG tablet Take 1 tablet (150 mg total) by mouth once a week.   4 tablet  0  . SYNTHROID 125 MCG tablet TAKE 1 TABLET BY MOUTH EVERY DAY  30 tablet  8  . albuterol (PROVENTIL HFA;VENTOLIN HFA) 108 (90 BASE) MCG/ACT inhaler Inhale 2 puffs into the lungs every 6 (six) hours as needed for wheezing.  1 Inhaler  0  . ARIPiprazole (ABILIFY) 2 MG tablet Take 1 tablet (2 mg total) by mouth daily.  30 tablet  3  . pantoprazole (PROTONIX) 40 MG tablet Take 1 tablet (40 mg total) by mouth daily.  30 tablet  3   No facility-administered encounter medications on file as of 09/29/2012.    BP 140/90  Pulse 92  Temp(Src) 98.5 F (36.9 C) (Oral)  Wt 210 lb (95.255 kg)  BMI 34.95 kg/m2  SpO2 97%  LMP 03/20/2001   Review of Systems  Constitutional: Positive for fatigue. Negative for fever, chills, appetite change and unexpected weight change.  HENT: Positive for neck pain. Negative for ear pain, congestion, sore throat, trouble swallowing, neck stiffness, voice change and sinus pressure.   Eyes: Negative for visual disturbance.  Respiratory: Negative for cough, shortness of breath, wheezing and stridor.   Cardiovascular: Negative for chest pain, palpitations and leg swelling.  Gastrointestinal: Negative for nausea, vomiting, abdominal pain, diarrhea, constipation, blood in stool, abdominal distention and anal bleeding.  Genitourinary: Negative for dysuria and flank pain.  Musculoskeletal: Positive for myalgias, back pain and arthralgias. Negative for gait problem.  Skin: Negative for color change and rash.  Neurological: Positive for weakness  and numbness. Negative for dizziness, tremors, syncope, light-headedness and headaches.  Hematological: Negative for adenopathy. Does not bruise/bleed easily.  Psychiatric/Behavioral: Negative for suicidal ideas, sleep disturbance and dysphoric mood. The patient is not nervous/anxious.        Objective:   Physical Exam  Constitutional: She is oriented to person, place, and time. She appears well-developed and  well-nourished. No distress.  HENT:  Head: Normocephalic and atraumatic.  Right Ear: External ear normal.  Left Ear: External ear normal.  Nose: Nose normal.  Mouth/Throat: Oropharynx is clear and moist. No oropharyngeal exudate.  Eyes: Conjunctivae are normal. Pupils are equal, round, and reactive to light. Right eye exhibits no discharge. Left eye exhibits no discharge. No scleral icterus.  Neck: Normal range of motion. Neck supple. No tracheal deviation present. No thyromegaly present.  Cardiovascular: Normal rate, regular rhythm, normal heart sounds and intact distal pulses.  Exam reveals no gallop and no friction rub.   No murmur heard. Pulmonary/Chest: Effort normal and breath sounds normal. No accessory muscle usage. Not tachypneic. No respiratory distress. She has no decreased breath sounds. She has no wheezes. She has no rhonchi. She has no rales. She exhibits no tenderness.  Musculoskeletal: Normal range of motion. She exhibits no edema and no tenderness.  Lymphadenopathy:    She has no cervical adenopathy.  Neurological: She is alert and oriented to person, place, and time. She displays no atrophy and no tremor. No cranial nerve deficit or sensory deficit. She exhibits normal muscle tone. Coordination and gait abnormal.  Skin: Skin is warm and dry. No rash noted. She is not diaphoretic. No erythema. No pallor.  Psychiatric: She has a normal mood and affect. Her behavior is normal. Judgment and thought content normal.          Assessment & Plan:  Over of which >50% spent in face-to-face contact with patient discussing plan of care

## 2012-09-29 NOTE — Assessment & Plan Note (Signed)
Chronic low back pain secondary to degenerative arthritis on Cymbalta. Patient would like to establish with local pain management. Referral placed.

## 2012-09-29 NOTE — Assessment & Plan Note (Signed)
History of Hodgkin's lymphoma treated with chemotherapy, radiation therapy, splenectomy. Given previous treatment, will need yearly echocardiogram and bilateral breast MRI. Will set up cardiology evaluation locally. MRI ordered.

## 2012-09-29 NOTE — Assessment & Plan Note (Signed)
Neck pain and bilateral arm and numbness after recent injury. Will get plain x-ray of the cervical spine. We discussed that we will likely also need to get MRI of the cervical spine in the future.

## 2012-09-30 ENCOUNTER — Ambulatory Visit (INDEPENDENT_AMBULATORY_CARE_PROVIDER_SITE_OTHER)
Admission: RE | Admit: 2012-09-30 | Discharge: 2012-09-30 | Disposition: A | Discharge status: Home / Self Care | Payer: Medicare Other | Source: Ambulatory Visit | Attending: Internal Medicine | Admitting: Internal Medicine

## 2012-09-30 ENCOUNTER — Telehealth: Payer: Self-pay | Admitting: *Deleted

## 2012-09-30 ENCOUNTER — Other Ambulatory Visit: Payer: Self-pay | Admitting: Internal Medicine

## 2012-09-30 DIAGNOSIS — M542 Cervicalgia: Secondary | ICD-10-CM

## 2012-09-30 DIAGNOSIS — S199XXA Unspecified injury of neck, initial encounter: Secondary | ICD-10-CM | POA: Diagnosis not present

## 2012-09-30 NOTE — Telephone Encounter (Signed)
OK. We need to send her for flexion and extension views of the cervical spine at [facility] or [facility].  Amber - Can you help set this up?

## 2012-09-30 NOTE — Telephone Encounter (Signed)
They do not do those type of xrays you requested.

## 2012-10-03 NOTE — Telephone Encounter (Signed)
Called to inform patient that she can have her plain film xrays at [facility]. I have placed an order upfront with the patients name on the envelope.

## 2012-10-03 NOTE — Telephone Encounter (Signed)
The patient will need to stop by our office to pick up a signed order as they stated they do not take apts for plain film xrays

## 2012-10-03 NOTE — Telephone Encounter (Signed)
Can you let her know

## 2012-10-04 DIAGNOSIS — Z8571 Personal history of Hodgkin lymphoma: Secondary | ICD-10-CM | POA: Diagnosis not present

## 2012-10-04 DIAGNOSIS — R262 Difficulty in walking, not elsewhere classified: Secondary | ICD-10-CM | POA: Diagnosis not present

## 2012-10-04 DIAGNOSIS — M6281 Muscle weakness (generalized): Secondary | ICD-10-CM | POA: Diagnosis not present

## 2012-10-04 DIAGNOSIS — J45909 Unspecified asthma, uncomplicated: Secondary | ICD-10-CM | POA: Diagnosis not present

## 2012-10-04 DIAGNOSIS — M542 Cervicalgia: Secondary | ICD-10-CM | POA: Diagnosis not present

## 2012-10-04 DIAGNOSIS — M545 Low back pain: Secondary | ICD-10-CM | POA: Diagnosis not present

## 2012-10-04 DIAGNOSIS — Z9181 History of falling: Secondary | ICD-10-CM | POA: Diagnosis not present

## 2012-10-04 DIAGNOSIS — Z5189 Encounter for other specified aftercare: Secondary | ICD-10-CM | POA: Diagnosis not present

## 2012-10-05 ENCOUNTER — Telehealth: Payer: Self-pay | Admitting: *Deleted

## 2012-10-05 DIAGNOSIS — M545 Low back pain: Secondary | ICD-10-CM | POA: Diagnosis not present

## 2012-10-05 DIAGNOSIS — M6281 Muscle weakness (generalized): Secondary | ICD-10-CM | POA: Diagnosis not present

## 2012-10-05 DIAGNOSIS — M542 Cervicalgia: Secondary | ICD-10-CM | POA: Diagnosis not present

## 2012-10-05 DIAGNOSIS — R262 Difficulty in walking, not elsewhere classified: Secondary | ICD-10-CM | POA: Diagnosis not present

## 2012-10-05 DIAGNOSIS — J45909 Unspecified asthma, uncomplicated: Secondary | ICD-10-CM | POA: Diagnosis not present

## 2012-10-05 DIAGNOSIS — Z5189 Encounter for other specified aftercare: Secondary | ICD-10-CM | POA: Diagnosis not present

## 2012-10-05 NOTE — Telephone Encounter (Signed)
Teresa [name], the physical therapist from [facility] stated he went out and evaluated the patient. He would like treat her for the neck pain and falls. Tiare, occupational therapist would like therapy once a week for 1 week and then 2 times a week for 3 weeks. She can be reached [phone]

## 2012-10-05 NOTE — Telephone Encounter (Signed)
Sounds good. Thanks 

## 2012-10-10 DIAGNOSIS — R262 Difficulty in walking, not elsewhere classified: Secondary | ICD-10-CM | POA: Diagnosis not present

## 2012-10-10 DIAGNOSIS — M542 Cervicalgia: Secondary | ICD-10-CM | POA: Diagnosis not present

## 2012-10-10 DIAGNOSIS — Z5189 Encounter for other specified aftercare: Secondary | ICD-10-CM | POA: Diagnosis not present

## 2012-10-10 DIAGNOSIS — J45909 Unspecified asthma, uncomplicated: Secondary | ICD-10-CM | POA: Diagnosis not present

## 2012-10-10 DIAGNOSIS — M6281 Muscle weakness (generalized): Secondary | ICD-10-CM | POA: Diagnosis not present

## 2012-10-10 DIAGNOSIS — M545 Low back pain: Secondary | ICD-10-CM | POA: Diagnosis not present

## 2012-10-10 NOTE — Telephone Encounter (Signed)
Faxing paperwork.

## 2012-10-11 ENCOUNTER — Telehealth: Payer: Self-pay | Admitting: Internal Medicine

## 2012-10-11 DIAGNOSIS — M545 Low back pain: Secondary | ICD-10-CM | POA: Diagnosis not present

## 2012-10-11 DIAGNOSIS — R262 Difficulty in walking, not elsewhere classified: Secondary | ICD-10-CM | POA: Diagnosis not present

## 2012-10-11 DIAGNOSIS — J45909 Unspecified asthma, uncomplicated: Secondary | ICD-10-CM | POA: Diagnosis not present

## 2012-10-11 DIAGNOSIS — M6281 Muscle weakness (generalized): Secondary | ICD-10-CM | POA: Diagnosis not present

## 2012-10-11 DIAGNOSIS — M542 Cervicalgia: Secondary | ICD-10-CM | POA: Diagnosis not present

## 2012-10-11 DIAGNOSIS — Z5189 Encounter for other specified aftercare: Secondary | ICD-10-CM | POA: Diagnosis not present

## 2012-10-11 NOTE — Telephone Encounter (Signed)
She needs to have the xrays first.

## 2012-10-11 NOTE — Telephone Encounter (Signed)
Left detailed message on patient voicemail. 

## 2012-10-11 NOTE — Telephone Encounter (Signed)
Patient called and stated she has not had her xrays done yet. The patient is wondering if she can go do a MRI of her neck/brain instead. Patient states she hasn't slept for 3 days and the patient is still having headaches.

## 2012-10-12 DIAGNOSIS — Z8571 Personal history of Hodgkin lymphoma: Secondary | ICD-10-CM | POA: Diagnosis not present

## 2012-10-12 DIAGNOSIS — Z09 Encounter for follow-up examination after completed treatment for conditions other than malignant neoplasm: Secondary | ICD-10-CM | POA: Diagnosis not present

## 2012-10-12 NOTE — Telephone Encounter (Signed)
LMTCB

## 2012-10-12 NOTE — Telephone Encounter (Signed)
Left detailed information on patient voicemail, did not return call.

## 2012-10-13 ENCOUNTER — Telehealth: Payer: Self-pay | Admitting: Internal Medicine

## 2012-10-13 NOTE — Telephone Encounter (Signed)
Recent MRI of the breasts from [facility] was normal.

## 2012-10-14 NOTE — Telephone Encounter (Signed)
Left detailed message on patient voicemail about mammogram

## 2012-10-20 ENCOUNTER — Telehealth: Payer: Self-pay | Admitting: Internal Medicine

## 2012-10-20 NOTE — Telephone Encounter (Signed)
She will need to contact the company that she wants to get the power wheelchair from. They will make an assessment of her home and then likely schedule a PT evaluation and clinic visit with [place] for power wheelchair assessment.

## 2012-10-20 NOTE — Telephone Encounter (Signed)
Left message to call back  

## 2012-10-20 NOTE — Telephone Encounter (Signed)
Patient left a voicemail stating she is waiting on an order for a power wheelchair for transportation. She can not get her xray done until she get this because she is having a hard time walking. Also would like an order for physical therapy.

## 2012-10-21 ENCOUNTER — Ambulatory Visit: Payer: Medicare Other | Admitting: Cardiovascular Disease

## 2012-10-21 ENCOUNTER — Telehealth: Payer: Self-pay | Admitting: Internal Medicine

## 2012-10-21 NOTE — Telephone Encounter (Signed)
Patient informed and verbalized understanding. She will give them a call

## 2012-10-21 NOTE — Telephone Encounter (Signed)
That is fine 

## 2012-10-21 NOTE — Telephone Encounter (Signed)
Blaize from [facility] left a message requesting an order for a bedside toilet for this patient. Please fax to [facility] [phone]

## 2012-10-24 DIAGNOSIS — Z5189 Encounter for other specified aftercare: Secondary | ICD-10-CM | POA: Diagnosis not present

## 2012-10-24 DIAGNOSIS — J45909 Unspecified asthma, uncomplicated: Secondary | ICD-10-CM | POA: Diagnosis not present

## 2012-10-24 DIAGNOSIS — M542 Cervicalgia: Secondary | ICD-10-CM | POA: Diagnosis not present

## 2012-10-24 DIAGNOSIS — M545 Low back pain: Secondary | ICD-10-CM | POA: Diagnosis not present

## 2012-10-24 DIAGNOSIS — M6281 Muscle weakness (generalized): Secondary | ICD-10-CM | POA: Diagnosis not present

## 2012-10-24 DIAGNOSIS — R262 Difficulty in walking, not elsewhere classified: Secondary | ICD-10-CM | POA: Diagnosis not present

## 2012-10-25 NOTE — Telephone Encounter (Signed)
Rx faxed

## 2012-10-26 DIAGNOSIS — M6281 Muscle weakness (generalized): Secondary | ICD-10-CM | POA: Diagnosis not present

## 2012-10-26 DIAGNOSIS — R262 Difficulty in walking, not elsewhere classified: Secondary | ICD-10-CM | POA: Diagnosis not present

## 2012-10-26 DIAGNOSIS — M545 Low back pain: Secondary | ICD-10-CM | POA: Diagnosis not present

## 2012-10-26 DIAGNOSIS — J45909 Unspecified asthma, uncomplicated: Secondary | ICD-10-CM | POA: Diagnosis not present

## 2012-10-26 DIAGNOSIS — Z5189 Encounter for other specified aftercare: Secondary | ICD-10-CM | POA: Diagnosis not present

## 2012-10-26 DIAGNOSIS — M542 Cervicalgia: Secondary | ICD-10-CM | POA: Diagnosis not present

## 2012-10-28 DIAGNOSIS — M542 Cervicalgia: Secondary | ICD-10-CM | POA: Diagnosis not present

## 2012-10-28 DIAGNOSIS — R209 Unspecified disturbances of skin sensation: Secondary | ICD-10-CM | POA: Diagnosis not present

## 2012-10-28 DIAGNOSIS — G8220 Paraplegia, unspecified: Secondary | ICD-10-CM | POA: Diagnosis not present

## 2012-10-28 DIAGNOSIS — M531 Cervicobrachial syndrome: Secondary | ICD-10-CM | POA: Diagnosis not present

## 2012-11-01 DIAGNOSIS — J45909 Unspecified asthma, uncomplicated: Secondary | ICD-10-CM | POA: Diagnosis not present

## 2012-11-01 DIAGNOSIS — M542 Cervicalgia: Secondary | ICD-10-CM | POA: Diagnosis not present

## 2012-11-01 DIAGNOSIS — R262 Difficulty in walking, not elsewhere classified: Secondary | ICD-10-CM | POA: Diagnosis not present

## 2012-11-01 DIAGNOSIS — M545 Low back pain: Secondary | ICD-10-CM | POA: Diagnosis not present

## 2012-11-01 DIAGNOSIS — Z5189 Encounter for other specified aftercare: Secondary | ICD-10-CM | POA: Diagnosis not present

## 2012-11-01 DIAGNOSIS — M6281 Muscle weakness (generalized): Secondary | ICD-10-CM | POA: Diagnosis not present

## 2012-11-03 ENCOUNTER — Telehealth: Payer: Self-pay | Admitting: Internal Medicine

## 2012-11-03 DIAGNOSIS — J45909 Unspecified asthma, uncomplicated: Secondary | ICD-10-CM | POA: Diagnosis not present

## 2012-11-03 DIAGNOSIS — M545 Low back pain: Secondary | ICD-10-CM | POA: Diagnosis not present

## 2012-11-03 DIAGNOSIS — M6281 Muscle weakness (generalized): Secondary | ICD-10-CM | POA: Diagnosis not present

## 2012-11-03 DIAGNOSIS — R262 Difficulty in walking, not elsewhere classified: Secondary | ICD-10-CM | POA: Diagnosis not present

## 2012-11-03 DIAGNOSIS — Z5189 Encounter for other specified aftercare: Secondary | ICD-10-CM | POA: Diagnosis not present

## 2012-11-03 DIAGNOSIS — M542 Cervicalgia: Secondary | ICD-10-CM | POA: Diagnosis not present

## 2012-11-03 NOTE — Telephone Encounter (Signed)
Fwd to Dr. Walker 

## 2012-11-03 NOTE — Telephone Encounter (Signed)
Asking for prescription for pt to receive a power wheelchair.  States pt is very weak and unstable and really needs the power wheelchair.  Needing order for this.  [facility] in [place] can provide power chair but the order has to come from Dr. Dan Humphreys. States once order is sent in they will be sending Dr. Dan Humphreys paperwork to justify need for the power chair.

## 2012-11-04 NOTE — Telephone Encounter (Signed)
I am happy to write an order for this, however most companies require a specific visit ONLY for the power wheelchair evaluation

## 2012-11-07 ENCOUNTER — Ambulatory Visit: Payer: Medicare Other | Admitting: Cardiovascular Disease

## 2012-11-07 ENCOUNTER — Ambulatory Visit: Payer: Self-pay | Admitting: Pain Medicine

## 2012-11-07 DIAGNOSIS — Z79899 Other long term (current) drug therapy: Secondary | ICD-10-CM | POA: Diagnosis not present

## 2012-11-07 DIAGNOSIS — IMO0002 Reserved for concepts with insufficient information to code with codable children: Secondary | ICD-10-CM | POA: Diagnosis not present

## 2012-11-07 DIAGNOSIS — M169 Osteoarthritis of hip, unspecified: Secondary | ICD-10-CM | POA: Diagnosis not present

## 2012-11-07 DIAGNOSIS — M545 Low back pain: Secondary | ICD-10-CM | POA: Diagnosis not present

## 2012-11-07 DIAGNOSIS — M216X9 Other acquired deformities of unspecified foot: Secondary | ICD-10-CM | POA: Diagnosis not present

## 2012-11-07 DIAGNOSIS — M961 Postlaminectomy syndrome, not elsewhere classified: Secondary | ICD-10-CM | POA: Diagnosis not present

## 2012-11-07 DIAGNOSIS — M4716 Other spondylosis with myelopathy, lumbar region: Secondary | ICD-10-CM | POA: Diagnosis not present

## 2012-11-07 DIAGNOSIS — M5137 Other intervertebral disc degeneration, lumbosacral region: Secondary | ICD-10-CM | POA: Diagnosis not present

## 2012-11-07 DIAGNOSIS — Z923 Personal history of irradiation: Secondary | ICD-10-CM | POA: Diagnosis not present

## 2012-11-07 DIAGNOSIS — G8929 Other chronic pain: Secondary | ICD-10-CM | POA: Diagnosis not present

## 2012-11-07 DIAGNOSIS — Z8571 Personal history of Hodgkin lymphoma: Secondary | ICD-10-CM | POA: Diagnosis not present

## 2012-11-07 DIAGNOSIS — Z7982 Long term (current) use of aspirin: Secondary | ICD-10-CM | POA: Diagnosis not present

## 2012-11-07 DIAGNOSIS — G894 Chronic pain syndrome: Secondary | ICD-10-CM | POA: Diagnosis not present

## 2012-11-08 DIAGNOSIS — M545 Low back pain: Secondary | ICD-10-CM | POA: Diagnosis not present

## 2012-11-08 DIAGNOSIS — M6281 Muscle weakness (generalized): Secondary | ICD-10-CM | POA: Diagnosis not present

## 2012-11-08 DIAGNOSIS — J45909 Unspecified asthma, uncomplicated: Secondary | ICD-10-CM | POA: Diagnosis not present

## 2012-11-08 DIAGNOSIS — M542 Cervicalgia: Secondary | ICD-10-CM | POA: Diagnosis not present

## 2012-11-08 DIAGNOSIS — R262 Difficulty in walking, not elsewhere classified: Secondary | ICD-10-CM | POA: Diagnosis not present

## 2012-11-08 DIAGNOSIS — Z5189 Encounter for other specified aftercare: Secondary | ICD-10-CM | POA: Diagnosis not present

## 2012-11-09 ENCOUNTER — Telehealth: Payer: Self-pay | Admitting: Internal Medicine

## 2012-11-09 NOTE — Telephone Encounter (Signed)
Left detailed message on patient voicemail. 

## 2012-11-09 NOTE — Telephone Encounter (Signed)
Face to face exam for mobility report was faxed.  Like an essay test with questions and answers.  This is just to give Dr. Mervyn Skeeters worksheet to get thoughts together and be ready for the appt.  Wants you to use it as a guide and answer the questions and put in progress notes.  The form itself does not have to be filled out but the questions must be answered and reported.  The date you sign what they sent to you, that will become the face to face date, not the actual appt date.    [phone] Sherrill Raring ETP with [facility]  [phone] cell.

## 2012-11-10 ENCOUNTER — Ambulatory Visit: Payer: Medicare Other | Admitting: Internal Medicine

## 2012-11-10 ENCOUNTER — Ambulatory Visit (INDEPENDENT_AMBULATORY_CARE_PROVIDER_SITE_OTHER): Payer: Medicare Other | Admitting: Internal Medicine

## 2012-11-10 ENCOUNTER — Encounter: Payer: Self-pay | Admitting: Internal Medicine

## 2012-11-10 VITALS — BP 112/70 | HR 107 | Temp 98.3°F

## 2012-11-10 DIAGNOSIS — Z23 Encounter for immunization: Secondary | ICD-10-CM

## 2012-11-10 DIAGNOSIS — R269 Unspecified abnormalities of gait and mobility: Secondary | ICD-10-CM | POA: Diagnosis not present

## 2012-11-10 MED ORDER — PROMETHAZINE HCL 12.5 MG PO TABS: 12.5000 mg | ORAL_TABLET | Freq: Three times a day (TID) | ORAL | Status: DC | PRN

## 2012-11-10 NOTE — Telephone Encounter (Signed)
Fwd to Dr. Dan Humphreys, patient has appointment today

## 2012-11-10 NOTE — Assessment & Plan Note (Addendum)
Patient has significant gait disturbance with weakness in her lower and upper extremities, decreased proprioception, and bilateral foot drop. She would greatly benefit from a power wheelchair. At present, she is using a rolling walker, however she is having frequent falls using this device. She also is limited in her ability to perform activities of daily living with use of her rolling walker. She would be unable to operate a standard wheelchair because of tremors in her upper extremities and decreased grip strength. She is unable to use a scooter in her home because of size limitations.  Over of which >50% spent in face-to-face contact with patient discussing plan of care

## 2012-11-10 NOTE — Progress Notes (Signed)
Subjective:    Patient ID: Teresa [name], female    DOB: Jul 18, 1965, 47 y.o.   MRN: [id number]  HPI 47 year old female with history of Hodgkin's lymphoma status post chemotherapy, radiation therapy now with chronic pain, chronic fatigue, neuropathy with foot drop bilaterally, frequent falls presents for evaluation for motorized wheelchair. She is currently using a rolling Hadiyah Maricle to ambulate around her home. She is only able to walk 10-20 feet with assistance of her Destyni Hoppel. Easily to use a Tabor Bartram, she frequently falls because of weakness in her legs. She also has intermittent tremors in her arms which make it difficult to maneuver her Dillan Candela. She has chronic pain in her lower back and lower extremities. She is currently followed at the pain clinic. She is having difficulty performing activities of daily living at home such as preparing her meals, and ambulating from her bedroom to her bathroom because of significant weakness and intermittent falls.  Outpatient Encounter Prescriptions as of 11/10/2012  Medication Sig Dispense Refill  . albuterol (PROVENTIL HFA;VENTOLIN HFA) 108 (90 BASE) MCG/ACT inhaler Inhale 2 puffs into the lungs every 6 (six) hours as needed for wheezing.  1 Inhaler  0  . ARIPiprazole (ABILIFY) 2 MG tablet Take 1 tablet (2 mg total) by mouth daily.  30 tablet  3  . aspirin 81 MG tablet Take 81 mg by mouth daily.      . cyclobenzaprine (FLEXERIL) 10 MG tablet Take 10 mg by mouth 2 (two) times daily as needed.       . diphenhydrAMINE (BENADRYL CHILDRENS ALLERGY) 12.5 MG/5ML liquid Take by mouth 4 (four) times daily as needed.      . DULoxetine (CYMBALTA) 60 MG capsule Take 1 capsule (60 mg total) by mouth daily.  30 capsule  5  . pantoprazole (PROTONIX) 40 MG tablet Take 1 tablet (40 mg total) by mouth daily.  30 tablet  3  . SYNTHROID 125 MCG tablet TAKE 1 TABLET BY MOUTH EVERY DAY  30 tablet  8  . [DISCONTINUED] fluconazole (DIFLUCAN) 150 MG tablet Take 1 tablet (150 mg total) by mouth  once a week.  4 tablet  0  . promethazine (PHENERGAN) 12.5 MG tablet Take 1 tablet (12.5 mg total) by mouth every 8 (eight) hours as needed for nausea.  30 tablet  3   No facility-administered encounter medications on file as of 11/10/2012.   BP 112/70  Pulse 107  Temp(Src) 98.3 F (36.8 C) (Oral)  SpO2 97%  LMP 03/20/2001  Review of Systems  Constitutional: Positive for fatigue. Negative for fever, chills, appetite change and unexpected weight change.  HENT: Negative for ear pain, congestion, sore throat, trouble swallowing, neck pain, voice change and sinus pressure.   Eyes: Negative for visual disturbance.  Respiratory: Negative for cough, shortness of breath, wheezing and stridor.   Cardiovascular: Negative for chest pain, palpitations and leg swelling.  Gastrointestinal: Negative for nausea, vomiting, abdominal pain, diarrhea, constipation, blood in stool, abdominal distention and anal bleeding.  Genitourinary: Negative for dysuria and flank pain.  Musculoskeletal: Positive for myalgias, arthralgias and gait problem.  Skin: Negative for color change and rash.  Neurological: Positive for tremors, weakness, light-headedness and numbness. Negative for dizziness and headaches.  Hematological: Negative for adenopathy. Does not bruise/bleed easily.  Psychiatric/Behavioral: Negative for suicidal ideas, sleep disturbance and dysphoric mood. The patient is not nervous/anxious.        Objective:   Physical Exam  Constitutional: She is oriented to person, place, and time. She appears well-developed and  well-nourished. No distress.  HENT:  Head: Normocephalic and atraumatic.  Right Ear: External ear normal.  Left Ear: External ear normal.  Nose: Nose normal.  Mouth/Throat: Oropharynx is clear and moist. No oropharyngeal exudate.  Eyes: Conjunctivae are normal. Pupils are equal, round, and reactive to light. Right eye exhibits no discharge. Left eye exhibits no discharge. No scleral  icterus.  Neck: Normal range of motion. Neck supple. No tracheal deviation present. No thyromegaly present.  Cardiovascular: Normal rate, regular rhythm, normal heart sounds and intact distal pulses.  Exam reveals no gallop and no friction rub.   No murmur heard. Pulmonary/Chest: Effort normal and breath sounds normal. No accessory muscle usage. Not tachypneic. No respiratory distress. She has no decreased breath sounds. She has no wheezes. She has no rhonchi. She has no rales. She exhibits no tenderness.  Musculoskeletal: She exhibits no edema and no tenderness.       Right hip: She exhibits decreased strength.       Left hip: She exhibits decreased strength.       Right knee: She exhibits normal range of motion and no swelling.       Left knee: She exhibits normal range of motion and no swelling.       Right ankle: She exhibits decreased range of motion. She exhibits no swelling.       Left ankle: She exhibits decreased range of motion. She exhibits no swelling.       Right hand: Decreased strength noted.       Left hand: Decreased strength (grip strength 4/5 however pt develops limiting tremor bilaterally L>R with grip) noted.  Bilateral foot drop, uses braces bilaterally to stabilize ankle. Strength foot flexion/extension 3/5 bilaterally.  Knee Flexion/Extension 4/5 bilaterally.   Hip Flexion/Extension 4/5 bilaterally.  Lymphadenopathy:    She has no cervical adenopathy.  Neurological: She is alert and oriented to person, place, and time. She displays tremor (intention left>right hand). No cranial nerve deficit. She exhibits abnormal muscle tone. Coordination and gait abnormal.  Foot drop bilaterally. Impaired coordination with decreased proprioception.  Skin: Skin is warm and dry. No rash noted. She is not diaphoretic. No erythema. No pallor.  Psychiatric: She has a normal mood and affect. Her behavior is normal. Judgment and thought content normal.          Assessment & Plan:

## 2012-11-14 DIAGNOSIS — J45909 Unspecified asthma, uncomplicated: Secondary | ICD-10-CM | POA: Diagnosis not present

## 2012-11-14 DIAGNOSIS — M542 Cervicalgia: Secondary | ICD-10-CM | POA: Diagnosis not present

## 2012-11-14 DIAGNOSIS — M6281 Muscle weakness (generalized): Secondary | ICD-10-CM | POA: Diagnosis not present

## 2012-11-14 DIAGNOSIS — M545 Low back pain: Secondary | ICD-10-CM | POA: Diagnosis not present

## 2012-11-14 DIAGNOSIS — Z5189 Encounter for other specified aftercare: Secondary | ICD-10-CM | POA: Diagnosis not present

## 2012-11-14 DIAGNOSIS — R262 Difficulty in walking, not elsewhere classified: Secondary | ICD-10-CM | POA: Diagnosis not present

## 2012-11-17 ENCOUNTER — Inpatient Hospital Stay: Payer: Self-pay | Admitting: Orthopedic Surgery

## 2012-11-17 DIAGNOSIS — M25559 Pain in unspecified hip: Secondary | ICD-10-CM | POA: Diagnosis not present

## 2012-11-17 DIAGNOSIS — Z01818 Encounter for other preprocedural examination: Secondary | ICD-10-CM | POA: Diagnosis not present

## 2012-11-17 DIAGNOSIS — G589 Mononeuropathy, unspecified: Secondary | ICD-10-CM | POA: Diagnosis present

## 2012-11-17 DIAGNOSIS — G43909 Migraine, unspecified, not intractable, without status migrainosus: Secondary | ICD-10-CM | POA: Diagnosis present

## 2012-11-17 DIAGNOSIS — T66XXXS Radiation sickness, unspecified, sequela: Secondary | ICD-10-CM | POA: Diagnosis not present

## 2012-11-17 DIAGNOSIS — E039 Hypothyroidism, unspecified: Secondary | ICD-10-CM | POA: Diagnosis not present

## 2012-11-17 DIAGNOSIS — S82899A Other fracture of unspecified lower leg, initial encounter for closed fracture: Secondary | ICD-10-CM | POA: Diagnosis not present

## 2012-11-17 DIAGNOSIS — K219 Gastro-esophageal reflux disease without esophagitis: Secondary | ICD-10-CM | POA: Diagnosis not present

## 2012-11-17 DIAGNOSIS — M216X9 Other acquired deformities of unspecified foot: Secondary | ICD-10-CM | POA: Diagnosis present

## 2012-11-17 DIAGNOSIS — G8929 Other chronic pain: Secondary | ICD-10-CM | POA: Diagnosis present

## 2012-11-17 DIAGNOSIS — M129 Arthropathy, unspecified: Secondary | ICD-10-CM | POA: Diagnosis present

## 2012-11-17 DIAGNOSIS — I359 Nonrheumatic aortic valve disorder, unspecified: Secondary | ICD-10-CM | POA: Diagnosis present

## 2012-11-17 DIAGNOSIS — S79919A Unspecified injury of unspecified hip, initial encounter: Secondary | ICD-10-CM | POA: Diagnosis not present

## 2012-11-17 DIAGNOSIS — M25579 Pain in unspecified ankle and joints of unspecified foot: Secondary | ICD-10-CM | POA: Diagnosis not present

## 2012-11-17 DIAGNOSIS — S82843A Displaced bimalleolar fracture of unspecified lower leg, initial encounter for closed fracture: Secondary | ICD-10-CM | POA: Diagnosis not present

## 2012-11-17 DIAGNOSIS — Z87898 Personal history of other specified conditions: Secondary | ICD-10-CM | POA: Diagnosis not present

## 2012-11-17 DIAGNOSIS — D62 Acute posthemorrhagic anemia: Secondary | ICD-10-CM | POA: Diagnosis not present

## 2012-11-17 DIAGNOSIS — IMO0002 Reserved for concepts with insufficient information to code with codable children: Secondary | ICD-10-CM | POA: Diagnosis not present

## 2012-11-17 DIAGNOSIS — F17200 Nicotine dependence, unspecified, uncomplicated: Secondary | ICD-10-CM | POA: Diagnosis not present

## 2012-11-17 DIAGNOSIS — Z7982 Long term (current) use of aspirin: Secondary | ICD-10-CM | POA: Diagnosis not present

## 2012-11-17 DIAGNOSIS — M549 Dorsalgia, unspecified: Secondary | ICD-10-CM | POA: Diagnosis not present

## 2012-11-17 DIAGNOSIS — J8410 Pulmonary fibrosis, unspecified: Secondary | ICD-10-CM | POA: Diagnosis not present

## 2012-11-17 LAB — PROTIME-INR
INR: 1
Prothrombin Time: 12.9 secs (ref 11.5–14.7)

## 2012-11-17 LAB — COMPREHENSIVE METABOLIC PANEL
Albumin: 3.5 g/dL (ref 3.4–5.0)
Alkaline Phosphatase: 79 U/L (ref 50–136)
Anion Gap: 4 — ABNORMAL LOW (ref 7–16)
Bilirubin,Total: 0.2 mg/dL (ref 0.2–1.0)
Calcium, Total: 9.1 mg/dL (ref 8.5–10.1)
Co2: 27 mmol/L (ref 21–32)
EGFR (African American): >60
Osmolality: 277 (ref 275–301)
Potassium: 4 mmol/L (ref 3.5–5.1)
SGOT(AST): 24 U/L (ref 15–37)
SGPT (ALT): 23 U/L (ref 12–78)
Sodium: 139 mmol/L (ref 136–145)
Total Protein: 7.3 g/dL (ref 6.4–8.2)

## 2012-11-17 LAB — URINALYSIS, COMPLETE
Bacteria: NONE SEEN
Bilirubin,UR: NEGATIVE
Blood: NEGATIVE
Glucose,UR: NEGATIVE mg/dL (ref 0–75)
Ketone: NEGATIVE
Leukocyte Esterase: NEGATIVE
Ph: 5 (ref 4.5–8.0)
RBC,UR: 1 /HPF (ref 0–5)
Specific Gravity: 1.005 (ref 1.003–1.030)
Squamous Epithelial: <1

## 2012-11-17 LAB — CBC
HGB: 12.8 g/dL (ref 12.0–16.0)
MCH: 31.7 pg (ref 26.0–34.0)
Platelet: 414 10*3/uL (ref 150–440)
RDW: 13.8 % (ref 11.5–14.5)
WBC: 11.4 10*3/uL — ABNORMAL HIGH (ref 3.6–11.0)

## 2012-11-17 LAB — APTT: Activated PTT: 32.8 secs (ref 23.6–35.9)

## 2012-11-18 LAB — CBC WITH DIFFERENTIAL/PLATELET
Basophil #: 0.1 10*3/uL (ref 0.0–0.1)
Basophil %: 1.3 %
Eosinophil #: 0.3 10*3/uL (ref 0.0–0.7)
Eosinophil %: 2.6 %
HCT: 33 % — ABNORMAL LOW (ref 35.0–47.0)
HGB: 11.1 g/dL — ABNORMAL LOW (ref 12.0–16.0)
Lymphocyte #: 3.5 10*3/uL (ref 1.0–3.6)
Lymphocyte %: 35.3 %
MCH: 32.3 pg (ref 26.0–34.0)
MCHC: 33.6 g/dL (ref 32.0–36.0)
MCV: 96 fL (ref 80–100)
Neutrophil #: 4.9 10*3/uL (ref 1.4–6.5)
Neutrophil %: 49.8 %
Platelet: 370 10*3/uL (ref 150–440)
RBC: 3.43 10*6/uL — ABNORMAL LOW (ref 3.80–5.20)

## 2012-11-19 LAB — BASIC METABOLIC PANEL
Anion Gap: 5 — ABNORMAL LOW (ref 7–16)
BUN: 11 mg/dL (ref 7–18)
Co2: 26 mmol/L (ref 21–32)
Creatinine: 0.89 mg/dL (ref 0.60–1.30)
Glucose: 107 mg/dL — ABNORMAL HIGH (ref 65–99)
Osmolality: 274 (ref 275–301)
Potassium: 3.7 mmol/L (ref 3.5–5.1)

## 2012-11-19 LAB — HEMOGLOBIN: HGB: 10.4 g/dL — ABNORMAL LOW (ref 12.0–16.0)

## 2012-11-21 ENCOUNTER — Ambulatory Visit: Payer: Medicare Other | Admitting: Cardiovascular Disease

## 2012-11-21 ENCOUNTER — Encounter: Payer: Self-pay | Admitting: *Deleted

## 2012-11-21 DIAGNOSIS — R262 Difficulty in walking, not elsewhere classified: Secondary | ICD-10-CM | POA: Diagnosis not present

## 2012-11-21 DIAGNOSIS — M6281 Muscle weakness (generalized): Secondary | ICD-10-CM | POA: Diagnosis not present

## 2012-11-21 DIAGNOSIS — J45909 Unspecified asthma, uncomplicated: Secondary | ICD-10-CM | POA: Diagnosis not present

## 2012-11-21 DIAGNOSIS — Z5189 Encounter for other specified aftercare: Secondary | ICD-10-CM | POA: Diagnosis not present

## 2012-11-21 DIAGNOSIS — M545 Low back pain: Secondary | ICD-10-CM | POA: Diagnosis not present

## 2012-11-21 DIAGNOSIS — M542 Cervicalgia: Secondary | ICD-10-CM | POA: Diagnosis not present

## 2012-11-23 DIAGNOSIS — S82843A Displaced bimalleolar fracture of unspecified lower leg, initial encounter for closed fracture: Secondary | ICD-10-CM | POA: Diagnosis not present

## 2012-11-24 DIAGNOSIS — R262 Difficulty in walking, not elsewhere classified: Secondary | ICD-10-CM | POA: Diagnosis not present

## 2012-11-24 DIAGNOSIS — J45909 Unspecified asthma, uncomplicated: Secondary | ICD-10-CM | POA: Diagnosis not present

## 2012-11-24 DIAGNOSIS — M542 Cervicalgia: Secondary | ICD-10-CM | POA: Diagnosis not present

## 2012-11-24 DIAGNOSIS — Z5189 Encounter for other specified aftercare: Secondary | ICD-10-CM | POA: Diagnosis not present

## 2012-11-24 DIAGNOSIS — M545 Low back pain: Secondary | ICD-10-CM | POA: Diagnosis not present

## 2012-11-24 DIAGNOSIS — M6281 Muscle weakness (generalized): Secondary | ICD-10-CM | POA: Diagnosis not present

## 2012-11-25 DIAGNOSIS — M542 Cervicalgia: Secondary | ICD-10-CM | POA: Diagnosis not present

## 2012-11-25 DIAGNOSIS — M6281 Muscle weakness (generalized): Secondary | ICD-10-CM | POA: Diagnosis not present

## 2012-11-25 DIAGNOSIS — R262 Difficulty in walking, not elsewhere classified: Secondary | ICD-10-CM | POA: Diagnosis not present

## 2012-11-25 DIAGNOSIS — M545 Low back pain: Secondary | ICD-10-CM | POA: Diagnosis not present

## 2012-11-25 DIAGNOSIS — Z5189 Encounter for other specified aftercare: Secondary | ICD-10-CM | POA: Diagnosis not present

## 2012-11-25 DIAGNOSIS — J45909 Unspecified asthma, uncomplicated: Secondary | ICD-10-CM | POA: Diagnosis not present

## 2012-11-29 DIAGNOSIS — M542 Cervicalgia: Secondary | ICD-10-CM | POA: Diagnosis not present

## 2012-11-29 DIAGNOSIS — R262 Difficulty in walking, not elsewhere classified: Secondary | ICD-10-CM | POA: Diagnosis not present

## 2012-11-29 DIAGNOSIS — M545 Low back pain: Secondary | ICD-10-CM | POA: Diagnosis not present

## 2012-11-29 DIAGNOSIS — M6281 Muscle weakness (generalized): Secondary | ICD-10-CM | POA: Diagnosis not present

## 2012-11-29 DIAGNOSIS — J45909 Unspecified asthma, uncomplicated: Secondary | ICD-10-CM | POA: Diagnosis not present

## 2012-11-29 DIAGNOSIS — Z5189 Encounter for other specified aftercare: Secondary | ICD-10-CM | POA: Diagnosis not present

## 2012-12-01 DIAGNOSIS — M6281 Muscle weakness (generalized): Secondary | ICD-10-CM | POA: Diagnosis not present

## 2012-12-01 DIAGNOSIS — R262 Difficulty in walking, not elsewhere classified: Secondary | ICD-10-CM | POA: Diagnosis not present

## 2012-12-01 DIAGNOSIS — M542 Cervicalgia: Secondary | ICD-10-CM | POA: Diagnosis not present

## 2012-12-01 DIAGNOSIS — S8290XD Unspecified fracture of unspecified lower leg, subsequent encounter for closed fracture with routine healing: Secondary | ICD-10-CM | POA: Diagnosis not present

## 2012-12-01 DIAGNOSIS — J45909 Unspecified asthma, uncomplicated: Secondary | ICD-10-CM | POA: Diagnosis not present

## 2012-12-01 DIAGNOSIS — M545 Low back pain: Secondary | ICD-10-CM | POA: Diagnosis not present

## 2012-12-01 DIAGNOSIS — Z5189 Encounter for other specified aftercare: Secondary | ICD-10-CM | POA: Diagnosis not present

## 2012-12-02 DIAGNOSIS — M542 Cervicalgia: Secondary | ICD-10-CM | POA: Diagnosis not present

## 2012-12-02 DIAGNOSIS — R262 Difficulty in walking, not elsewhere classified: Secondary | ICD-10-CM | POA: Diagnosis not present

## 2012-12-02 DIAGNOSIS — M545 Low back pain: Secondary | ICD-10-CM | POA: Diagnosis not present

## 2012-12-02 DIAGNOSIS — M6281 Muscle weakness (generalized): Secondary | ICD-10-CM | POA: Diagnosis not present

## 2012-12-02 DIAGNOSIS — J45909 Unspecified asthma, uncomplicated: Secondary | ICD-10-CM | POA: Diagnosis not present

## 2012-12-02 DIAGNOSIS — Z5189 Encounter for other specified aftercare: Secondary | ICD-10-CM | POA: Diagnosis not present

## 2012-12-03 DIAGNOSIS — M545 Low back pain: Secondary | ICD-10-CM | POA: Diagnosis not present

## 2012-12-03 DIAGNOSIS — S8290XD Unspecified fracture of unspecified lower leg, subsequent encounter for closed fracture with routine healing: Secondary | ICD-10-CM | POA: Diagnosis not present

## 2012-12-03 DIAGNOSIS — J45909 Unspecified asthma, uncomplicated: Secondary | ICD-10-CM | POA: Diagnosis not present

## 2012-12-03 DIAGNOSIS — M542 Cervicalgia: Secondary | ICD-10-CM | POA: Diagnosis not present

## 2012-12-03 DIAGNOSIS — Z9181 History of falling: Secondary | ICD-10-CM | POA: Diagnosis not present

## 2012-12-03 DIAGNOSIS — IMO0001 Reserved for inherently not codable concepts without codable children: Secondary | ICD-10-CM | POA: Diagnosis not present

## 2012-12-03 DIAGNOSIS — M6281 Muscle weakness (generalized): Secondary | ICD-10-CM | POA: Diagnosis not present

## 2012-12-03 DIAGNOSIS — Z8571 Personal history of Hodgkin lymphoma: Secondary | ICD-10-CM | POA: Diagnosis not present

## 2012-12-07 DIAGNOSIS — J45909 Unspecified asthma, uncomplicated: Secondary | ICD-10-CM | POA: Diagnosis not present

## 2012-12-07 DIAGNOSIS — IMO0001 Reserved for inherently not codable concepts without codable children: Secondary | ICD-10-CM | POA: Diagnosis not present

## 2012-12-07 DIAGNOSIS — M542 Cervicalgia: Secondary | ICD-10-CM | POA: Diagnosis not present

## 2012-12-07 DIAGNOSIS — M545 Low back pain: Secondary | ICD-10-CM | POA: Diagnosis not present

## 2012-12-07 DIAGNOSIS — S8290XD Unspecified fracture of unspecified lower leg, subsequent encounter for closed fracture with routine healing: Secondary | ICD-10-CM | POA: Diagnosis not present

## 2012-12-07 DIAGNOSIS — M6281 Muscle weakness (generalized): Secondary | ICD-10-CM | POA: Diagnosis not present

## 2012-12-09 DIAGNOSIS — M6281 Muscle weakness (generalized): Secondary | ICD-10-CM | POA: Diagnosis not present

## 2012-12-09 DIAGNOSIS — J45909 Unspecified asthma, uncomplicated: Secondary | ICD-10-CM | POA: Diagnosis not present

## 2012-12-09 DIAGNOSIS — IMO0001 Reserved for inherently not codable concepts without codable children: Secondary | ICD-10-CM | POA: Diagnosis not present

## 2012-12-09 DIAGNOSIS — M545 Low back pain: Secondary | ICD-10-CM | POA: Diagnosis not present

## 2012-12-09 DIAGNOSIS — S8290XD Unspecified fracture of unspecified lower leg, subsequent encounter for closed fracture with routine healing: Secondary | ICD-10-CM | POA: Diagnosis not present

## 2012-12-09 DIAGNOSIS — M542 Cervicalgia: Secondary | ICD-10-CM | POA: Diagnosis not present

## 2012-12-14 DIAGNOSIS — M545 Low back pain: Secondary | ICD-10-CM | POA: Diagnosis not present

## 2012-12-14 DIAGNOSIS — M6281 Muscle weakness (generalized): Secondary | ICD-10-CM | POA: Diagnosis not present

## 2012-12-14 DIAGNOSIS — IMO0001 Reserved for inherently not codable concepts without codable children: Secondary | ICD-10-CM | POA: Diagnosis not present

## 2012-12-14 DIAGNOSIS — S8290XD Unspecified fracture of unspecified lower leg, subsequent encounter for closed fracture with routine healing: Secondary | ICD-10-CM | POA: Diagnosis not present

## 2012-12-14 DIAGNOSIS — M542 Cervicalgia: Secondary | ICD-10-CM | POA: Diagnosis not present

## 2012-12-14 DIAGNOSIS — J45909 Unspecified asthma, uncomplicated: Secondary | ICD-10-CM | POA: Diagnosis not present

## 2012-12-16 DIAGNOSIS — S8290XD Unspecified fracture of unspecified lower leg, subsequent encounter for closed fracture with routine healing: Secondary | ICD-10-CM | POA: Diagnosis not present

## 2012-12-19 DIAGNOSIS — J45909 Unspecified asthma, uncomplicated: Secondary | ICD-10-CM | POA: Diagnosis not present

## 2012-12-19 DIAGNOSIS — IMO0001 Reserved for inherently not codable concepts without codable children: Secondary | ICD-10-CM | POA: Diagnosis not present

## 2012-12-19 DIAGNOSIS — M542 Cervicalgia: Secondary | ICD-10-CM | POA: Diagnosis not present

## 2012-12-19 DIAGNOSIS — M545 Low back pain: Secondary | ICD-10-CM | POA: Diagnosis not present

## 2012-12-19 DIAGNOSIS — M6281 Muscle weakness (generalized): Secondary | ICD-10-CM | POA: Diagnosis not present

## 2012-12-19 DIAGNOSIS — S8290XD Unspecified fracture of unspecified lower leg, subsequent encounter for closed fracture with routine healing: Secondary | ICD-10-CM | POA: Diagnosis not present

## 2012-12-26 DIAGNOSIS — M542 Cervicalgia: Secondary | ICD-10-CM | POA: Diagnosis not present

## 2012-12-26 DIAGNOSIS — J45909 Unspecified asthma, uncomplicated: Secondary | ICD-10-CM | POA: Diagnosis not present

## 2012-12-26 DIAGNOSIS — M6281 Muscle weakness (generalized): Secondary | ICD-10-CM | POA: Diagnosis not present

## 2012-12-26 DIAGNOSIS — M545 Low back pain: Secondary | ICD-10-CM | POA: Diagnosis not present

## 2012-12-26 DIAGNOSIS — IMO0001 Reserved for inherently not codable concepts without codable children: Secondary | ICD-10-CM | POA: Diagnosis not present

## 2012-12-26 DIAGNOSIS — S8290XD Unspecified fracture of unspecified lower leg, subsequent encounter for closed fracture with routine healing: Secondary | ICD-10-CM | POA: Diagnosis not present

## 2012-12-30 DIAGNOSIS — S8290XD Unspecified fracture of unspecified lower leg, subsequent encounter for closed fracture with routine healing: Secondary | ICD-10-CM | POA: Diagnosis not present

## 2013-01-03 DIAGNOSIS — J45909 Unspecified asthma, uncomplicated: Secondary | ICD-10-CM | POA: Diagnosis not present

## 2013-01-03 DIAGNOSIS — S8290XD Unspecified fracture of unspecified lower leg, subsequent encounter for closed fracture with routine healing: Secondary | ICD-10-CM | POA: Diagnosis not present

## 2013-01-03 DIAGNOSIS — M6281 Muscle weakness (generalized): Secondary | ICD-10-CM | POA: Diagnosis not present

## 2013-01-03 DIAGNOSIS — IMO0001 Reserved for inherently not codable concepts without codable children: Secondary | ICD-10-CM | POA: Diagnosis not present

## 2013-01-03 DIAGNOSIS — M545 Low back pain: Secondary | ICD-10-CM | POA: Diagnosis not present

## 2013-01-03 DIAGNOSIS — M542 Cervicalgia: Secondary | ICD-10-CM | POA: Diagnosis not present

## 2013-01-09 DIAGNOSIS — IMO0001 Reserved for inherently not codable concepts without codable children: Secondary | ICD-10-CM | POA: Diagnosis not present

## 2013-01-09 DIAGNOSIS — S8290XD Unspecified fracture of unspecified lower leg, subsequent encounter for closed fracture with routine healing: Secondary | ICD-10-CM | POA: Diagnosis not present

## 2013-01-09 DIAGNOSIS — M6281 Muscle weakness (generalized): Secondary | ICD-10-CM | POA: Diagnosis not present

## 2013-01-09 DIAGNOSIS — M542 Cervicalgia: Secondary | ICD-10-CM | POA: Diagnosis not present

## 2013-01-09 DIAGNOSIS — M545 Low back pain: Secondary | ICD-10-CM | POA: Diagnosis not present

## 2013-01-09 DIAGNOSIS — J45909 Unspecified asthma, uncomplicated: Secondary | ICD-10-CM | POA: Diagnosis not present

## 2013-01-16 DIAGNOSIS — M545 Low back pain: Secondary | ICD-10-CM | POA: Diagnosis not present

## 2013-01-16 DIAGNOSIS — M6281 Muscle weakness (generalized): Secondary | ICD-10-CM | POA: Diagnosis not present

## 2013-01-16 DIAGNOSIS — IMO0001 Reserved for inherently not codable concepts without codable children: Secondary | ICD-10-CM | POA: Diagnosis not present

## 2013-01-16 DIAGNOSIS — S8290XD Unspecified fracture of unspecified lower leg, subsequent encounter for closed fracture with routine healing: Secondary | ICD-10-CM | POA: Diagnosis not present

## 2013-01-16 DIAGNOSIS — J45909 Unspecified asthma, uncomplicated: Secondary | ICD-10-CM | POA: Diagnosis not present

## 2013-01-16 DIAGNOSIS — M542 Cervicalgia: Secondary | ICD-10-CM | POA: Diagnosis not present

## 2013-01-19 DIAGNOSIS — IMO0001 Reserved for inherently not codable concepts without codable children: Secondary | ICD-10-CM | POA: Diagnosis not present

## 2013-01-19 DIAGNOSIS — S8290XD Unspecified fracture of unspecified lower leg, subsequent encounter for closed fracture with routine healing: Secondary | ICD-10-CM | POA: Diagnosis not present

## 2013-01-19 DIAGNOSIS — M6281 Muscle weakness (generalized): Secondary | ICD-10-CM | POA: Diagnosis not present

## 2013-01-19 DIAGNOSIS — J45909 Unspecified asthma, uncomplicated: Secondary | ICD-10-CM | POA: Diagnosis not present

## 2013-01-19 DIAGNOSIS — M545 Low back pain: Secondary | ICD-10-CM | POA: Diagnosis not present

## 2013-01-19 DIAGNOSIS — M542 Cervicalgia: Secondary | ICD-10-CM | POA: Diagnosis not present

## 2013-01-20 DIAGNOSIS — S8290XD Unspecified fracture of unspecified lower leg, subsequent encounter for closed fracture with routine healing: Secondary | ICD-10-CM | POA: Diagnosis not present

## 2013-01-20 DIAGNOSIS — M238X9 Other internal derangements of unspecified knee: Secondary | ICD-10-CM | POA: Diagnosis not present

## 2013-01-29 ENCOUNTER — Other Ambulatory Visit: Payer: Self-pay | Admitting: Internal Medicine

## 2013-01-31 DIAGNOSIS — M542 Cervicalgia: Secondary | ICD-10-CM | POA: Diagnosis not present

## 2013-01-31 DIAGNOSIS — S8290XD Unspecified fracture of unspecified lower leg, subsequent encounter for closed fracture with routine healing: Secondary | ICD-10-CM | POA: Diagnosis not present

## 2013-01-31 DIAGNOSIS — J45909 Unspecified asthma, uncomplicated: Secondary | ICD-10-CM | POA: Diagnosis not present

## 2013-01-31 DIAGNOSIS — IMO0001 Reserved for inherently not codable concepts without codable children: Secondary | ICD-10-CM | POA: Diagnosis not present

## 2013-01-31 DIAGNOSIS — M545 Low back pain: Secondary | ICD-10-CM | POA: Diagnosis not present

## 2013-01-31 DIAGNOSIS — M6281 Muscle weakness (generalized): Secondary | ICD-10-CM | POA: Diagnosis not present

## 2013-02-01 DIAGNOSIS — Z9181 History of falling: Secondary | ICD-10-CM | POA: Diagnosis not present

## 2013-02-01 DIAGNOSIS — Z8571 Personal history of Hodgkin lymphoma: Secondary | ICD-10-CM | POA: Diagnosis not present

## 2013-02-01 DIAGNOSIS — J45909 Unspecified asthma, uncomplicated: Secondary | ICD-10-CM | POA: Diagnosis not present

## 2013-02-01 DIAGNOSIS — IMO0001 Reserved for inherently not codable concepts without codable children: Secondary | ICD-10-CM | POA: Diagnosis not present

## 2013-02-01 DIAGNOSIS — M542 Cervicalgia: Secondary | ICD-10-CM | POA: Diagnosis not present

## 2013-02-01 DIAGNOSIS — M6281 Muscle weakness (generalized): Secondary | ICD-10-CM | POA: Diagnosis not present

## 2013-02-01 DIAGNOSIS — M545 Low back pain: Secondary | ICD-10-CM | POA: Diagnosis not present

## 2013-02-03 ENCOUNTER — Ambulatory Visit: Payer: Self-pay | Admitting: Unknown Physician Specialty

## 2013-02-03 DIAGNOSIS — R609 Edema, unspecified: Secondary | ICD-10-CM | POA: Diagnosis not present

## 2013-02-03 DIAGNOSIS — M25569 Pain in unspecified knee: Secondary | ICD-10-CM | POA: Diagnosis not present

## 2013-02-03 DIAGNOSIS — S8990XA Unspecified injury of unspecified lower leg, initial encounter: Secondary | ICD-10-CM | POA: Diagnosis not present

## 2013-02-06 DIAGNOSIS — M25569 Pain in unspecified knee: Secondary | ICD-10-CM | POA: Diagnosis not present

## 2013-02-07 DIAGNOSIS — M6281 Muscle weakness (generalized): Secondary | ICD-10-CM | POA: Diagnosis not present

## 2013-02-07 DIAGNOSIS — J45909 Unspecified asthma, uncomplicated: Secondary | ICD-10-CM | POA: Diagnosis not present

## 2013-02-07 DIAGNOSIS — M545 Low back pain: Secondary | ICD-10-CM | POA: Diagnosis not present

## 2013-02-07 DIAGNOSIS — IMO0001 Reserved for inherently not codable concepts without codable children: Secondary | ICD-10-CM | POA: Diagnosis not present

## 2013-02-07 DIAGNOSIS — M542 Cervicalgia: Secondary | ICD-10-CM | POA: Diagnosis not present

## 2013-02-10 DIAGNOSIS — R209 Unspecified disturbances of skin sensation: Secondary | ICD-10-CM | POA: Diagnosis not present

## 2013-02-10 DIAGNOSIS — M4802 Spinal stenosis, cervical region: Secondary | ICD-10-CM | POA: Diagnosis not present

## 2013-02-10 DIAGNOSIS — R599 Enlarged lymph nodes, unspecified: Secondary | ICD-10-CM | POA: Diagnosis not present

## 2013-02-10 DIAGNOSIS — R51 Headache: Secondary | ICD-10-CM | POA: Diagnosis not present

## 2013-02-10 DIAGNOSIS — M47812 Spondylosis without myelopathy or radiculopathy, cervical region: Secondary | ICD-10-CM | POA: Diagnosis not present

## 2013-02-10 DIAGNOSIS — M5030 Other cervical disc degeneration, unspecified cervical region: Secondary | ICD-10-CM | POA: Diagnosis not present

## 2013-02-13 DIAGNOSIS — M79609 Pain in unspecified limb: Secondary | ICD-10-CM | POA: Diagnosis not present

## 2013-02-13 DIAGNOSIS — M6281 Muscle weakness (generalized): Secondary | ICD-10-CM | POA: Diagnosis not present

## 2013-02-13 DIAGNOSIS — M531 Cervicobrachial syndrome: Secondary | ICD-10-CM | POA: Diagnosis not present

## 2013-02-14 DIAGNOSIS — IMO0001 Reserved for inherently not codable concepts without codable children: Secondary | ICD-10-CM | POA: Diagnosis not present

## 2013-02-14 DIAGNOSIS — J45909 Unspecified asthma, uncomplicated: Secondary | ICD-10-CM | POA: Diagnosis not present

## 2013-02-14 DIAGNOSIS — M6281 Muscle weakness (generalized): Secondary | ICD-10-CM | POA: Diagnosis not present

## 2013-02-14 DIAGNOSIS — M542 Cervicalgia: Secondary | ICD-10-CM | POA: Diagnosis not present

## 2013-02-14 DIAGNOSIS — M545 Low back pain: Secondary | ICD-10-CM | POA: Diagnosis not present

## 2013-02-17 DIAGNOSIS — J45909 Unspecified asthma, uncomplicated: Secondary | ICD-10-CM | POA: Diagnosis not present

## 2013-02-17 DIAGNOSIS — M542 Cervicalgia: Secondary | ICD-10-CM | POA: Diagnosis not present

## 2013-02-17 DIAGNOSIS — IMO0001 Reserved for inherently not codable concepts without codable children: Secondary | ICD-10-CM | POA: Diagnosis not present

## 2013-02-17 DIAGNOSIS — M545 Low back pain: Secondary | ICD-10-CM | POA: Diagnosis not present

## 2013-02-17 DIAGNOSIS — M6281 Muscle weakness (generalized): Secondary | ICD-10-CM | POA: Diagnosis not present

## 2013-02-20 DIAGNOSIS — IMO0001 Reserved for inherently not codable concepts without codable children: Secondary | ICD-10-CM | POA: Diagnosis not present

## 2013-02-20 DIAGNOSIS — M6281 Muscle weakness (generalized): Secondary | ICD-10-CM | POA: Diagnosis not present

## 2013-02-20 DIAGNOSIS — M542 Cervicalgia: Secondary | ICD-10-CM | POA: Diagnosis not present

## 2013-02-20 DIAGNOSIS — J45909 Unspecified asthma, uncomplicated: Secondary | ICD-10-CM | POA: Diagnosis not present

## 2013-02-20 DIAGNOSIS — M545 Low back pain: Secondary | ICD-10-CM | POA: Diagnosis not present

## 2013-02-27 DIAGNOSIS — J45909 Unspecified asthma, uncomplicated: Secondary | ICD-10-CM | POA: Diagnosis not present

## 2013-02-27 DIAGNOSIS — M531 Cervicobrachial syndrome: Secondary | ICD-10-CM | POA: Diagnosis not present

## 2013-02-27 DIAGNOSIS — IMO0001 Reserved for inherently not codable concepts without codable children: Secondary | ICD-10-CM | POA: Diagnosis not present

## 2013-02-27 DIAGNOSIS — M542 Cervicalgia: Secondary | ICD-10-CM | POA: Diagnosis not present

## 2013-02-27 DIAGNOSIS — M6281 Muscle weakness (generalized): Secondary | ICD-10-CM | POA: Diagnosis not present

## 2013-02-27 DIAGNOSIS — M545 Low back pain: Secondary | ICD-10-CM | POA: Diagnosis not present

## 2013-03-04 ENCOUNTER — Other Ambulatory Visit: Payer: Self-pay | Admitting: Internal Medicine

## 2013-03-06 DIAGNOSIS — M545 Low back pain, unspecified: Secondary | ICD-10-CM | POA: Diagnosis not present

## 2013-03-06 DIAGNOSIS — J45909 Unspecified asthma, uncomplicated: Secondary | ICD-10-CM | POA: Diagnosis not present

## 2013-03-06 DIAGNOSIS — M542 Cervicalgia: Secondary | ICD-10-CM | POA: Diagnosis not present

## 2013-03-06 DIAGNOSIS — IMO0001 Reserved for inherently not codable concepts without codable children: Secondary | ICD-10-CM | POA: Diagnosis not present

## 2013-03-06 DIAGNOSIS — M6281 Muscle weakness (generalized): Secondary | ICD-10-CM | POA: Diagnosis not present

## 2013-03-08 DIAGNOSIS — S82843A Displaced bimalleolar fracture of unspecified lower leg, initial encounter for closed fracture: Secondary | ICD-10-CM | POA: Diagnosis not present

## 2013-03-09 DIAGNOSIS — M542 Cervicalgia: Secondary | ICD-10-CM | POA: Diagnosis not present

## 2013-03-09 DIAGNOSIS — IMO0001 Reserved for inherently not codable concepts without codable children: Secondary | ICD-10-CM | POA: Diagnosis not present

## 2013-03-09 DIAGNOSIS — M545 Low back pain, unspecified: Secondary | ICD-10-CM | POA: Diagnosis not present

## 2013-03-09 DIAGNOSIS — J45909 Unspecified asthma, uncomplicated: Secondary | ICD-10-CM | POA: Diagnosis not present

## 2013-03-09 DIAGNOSIS — M6281 Muscle weakness (generalized): Secondary | ICD-10-CM | POA: Diagnosis not present

## 2013-03-22 DIAGNOSIS — M542 Cervicalgia: Secondary | ICD-10-CM | POA: Diagnosis not present

## 2013-03-22 DIAGNOSIS — IMO0001 Reserved for inherently not codable concepts without codable children: Secondary | ICD-10-CM | POA: Diagnosis not present

## 2013-03-22 DIAGNOSIS — M6281 Muscle weakness (generalized): Secondary | ICD-10-CM | POA: Diagnosis not present

## 2013-03-22 DIAGNOSIS — M545 Low back pain, unspecified: Secondary | ICD-10-CM | POA: Diagnosis not present

## 2013-03-22 DIAGNOSIS — J45909 Unspecified asthma, uncomplicated: Secondary | ICD-10-CM | POA: Diagnosis not present

## 2013-03-23 DIAGNOSIS — M542 Cervicalgia: Secondary | ICD-10-CM | POA: Diagnosis not present

## 2013-03-23 DIAGNOSIS — M545 Low back pain, unspecified: Secondary | ICD-10-CM | POA: Diagnosis not present

## 2013-03-23 DIAGNOSIS — IMO0001 Reserved for inherently not codable concepts without codable children: Secondary | ICD-10-CM | POA: Diagnosis not present

## 2013-03-23 DIAGNOSIS — J45909 Unspecified asthma, uncomplicated: Secondary | ICD-10-CM | POA: Diagnosis not present

## 2013-03-23 DIAGNOSIS — M6281 Muscle weakness (generalized): Secondary | ICD-10-CM | POA: Diagnosis not present

## 2013-03-28 DIAGNOSIS — M545 Low back pain, unspecified: Secondary | ICD-10-CM | POA: Diagnosis not present

## 2013-03-28 DIAGNOSIS — J45909 Unspecified asthma, uncomplicated: Secondary | ICD-10-CM | POA: Diagnosis not present

## 2013-03-28 DIAGNOSIS — M542 Cervicalgia: Secondary | ICD-10-CM | POA: Diagnosis not present

## 2013-03-28 DIAGNOSIS — M6281 Muscle weakness (generalized): Secondary | ICD-10-CM | POA: Diagnosis not present

## 2013-03-28 DIAGNOSIS — IMO0001 Reserved for inherently not codable concepts without codable children: Secondary | ICD-10-CM | POA: Diagnosis not present

## 2013-03-30 DIAGNOSIS — M6281 Muscle weakness (generalized): Secondary | ICD-10-CM | POA: Diagnosis not present

## 2013-03-30 DIAGNOSIS — IMO0001 Reserved for inherently not codable concepts without codable children: Secondary | ICD-10-CM | POA: Diagnosis not present

## 2013-03-30 DIAGNOSIS — M542 Cervicalgia: Secondary | ICD-10-CM | POA: Diagnosis not present

## 2013-03-30 DIAGNOSIS — M545 Low back pain, unspecified: Secondary | ICD-10-CM | POA: Diagnosis not present

## 2013-03-30 DIAGNOSIS — J45909 Unspecified asthma, uncomplicated: Secondary | ICD-10-CM | POA: Diagnosis not present

## 2013-04-02 DIAGNOSIS — IMO0001 Reserved for inherently not codable concepts without codable children: Secondary | ICD-10-CM | POA: Diagnosis not present

## 2013-04-02 DIAGNOSIS — Z8571 Personal history of Hodgkin lymphoma: Secondary | ICD-10-CM | POA: Diagnosis not present

## 2013-04-02 DIAGNOSIS — M545 Low back pain, unspecified: Secondary | ICD-10-CM | POA: Diagnosis not present

## 2013-04-02 DIAGNOSIS — M6281 Muscle weakness (generalized): Secondary | ICD-10-CM | POA: Diagnosis not present

## 2013-04-02 DIAGNOSIS — Z9181 History of falling: Secondary | ICD-10-CM | POA: Diagnosis not present

## 2013-04-02 DIAGNOSIS — J45909 Unspecified asthma, uncomplicated: Secondary | ICD-10-CM | POA: Diagnosis not present

## 2013-04-02 DIAGNOSIS — M542 Cervicalgia: Secondary | ICD-10-CM | POA: Diagnosis not present

## 2013-04-05 ENCOUNTER — Emergency Department: Payer: Self-pay | Admitting: Internal Medicine

## 2013-04-05 DIAGNOSIS — Z9079 Acquired absence of other genital organ(s): Secondary | ICD-10-CM | POA: Diagnosis not present

## 2013-04-05 DIAGNOSIS — K219 Gastro-esophageal reflux disease without esophagitis: Secondary | ICD-10-CM | POA: Diagnosis not present

## 2013-04-05 DIAGNOSIS — Z888 Allergy status to other drugs, medicaments and biological substances status: Secondary | ICD-10-CM | POA: Diagnosis not present

## 2013-04-05 DIAGNOSIS — R059 Cough, unspecified: Secondary | ICD-10-CM | POA: Diagnosis not present

## 2013-04-05 DIAGNOSIS — M25519 Pain in unspecified shoulder: Secondary | ICD-10-CM | POA: Diagnosis not present

## 2013-04-05 DIAGNOSIS — S4980XA Other specified injuries of shoulder and upper arm, unspecified arm, initial encounter: Secondary | ICD-10-CM | POA: Diagnosis not present

## 2013-04-05 DIAGNOSIS — J189 Pneumonia, unspecified organism: Secondary | ICD-10-CM | POA: Diagnosis not present

## 2013-04-05 DIAGNOSIS — Z9889 Other specified postprocedural states: Secondary | ICD-10-CM | POA: Diagnosis not present

## 2013-04-05 DIAGNOSIS — R05 Cough: Secondary | ICD-10-CM | POA: Diagnosis not present

## 2013-04-05 DIAGNOSIS — S46909A Unspecified injury of unspecified muscle, fascia and tendon at shoulder and upper arm level, unspecified arm, initial encounter: Secondary | ICD-10-CM | POA: Diagnosis not present

## 2013-04-05 DIAGNOSIS — Z79899 Other long term (current) drug therapy: Secondary | ICD-10-CM | POA: Diagnosis not present

## 2013-04-05 DIAGNOSIS — Z880 Allergy status to penicillin: Secondary | ICD-10-CM | POA: Diagnosis not present

## 2013-04-10 DIAGNOSIS — M542 Cervicalgia: Secondary | ICD-10-CM | POA: Diagnosis not present

## 2013-04-10 DIAGNOSIS — M216X9 Other acquired deformities of unspecified foot: Secondary | ICD-10-CM | POA: Diagnosis not present

## 2013-04-10 DIAGNOSIS — Z5189 Encounter for other specified aftercare: Secondary | ICD-10-CM | POA: Diagnosis not present

## 2013-04-17 DIAGNOSIS — R002 Palpitations: Secondary | ICD-10-CM | POA: Diagnosis not present

## 2013-04-17 DIAGNOSIS — I4891 Unspecified atrial fibrillation: Secondary | ICD-10-CM | POA: Diagnosis not present

## 2013-04-17 DIAGNOSIS — I059 Rheumatic mitral valve disease, unspecified: Secondary | ICD-10-CM | POA: Diagnosis not present

## 2013-05-02 DIAGNOSIS — R059 Cough, unspecified: Secondary | ICD-10-CM | POA: Diagnosis not present

## 2013-05-02 DIAGNOSIS — Z452 Encounter for adjustment and management of vascular access device: Secondary | ICD-10-CM | POA: Diagnosis not present

## 2013-05-02 DIAGNOSIS — R509 Fever, unspecified: Secondary | ICD-10-CM | POA: Diagnosis not present

## 2013-05-02 DIAGNOSIS — R0989 Other specified symptoms and signs involving the circulatory and respiratory systems: Secondary | ICD-10-CM | POA: Diagnosis not present

## 2013-05-02 DIAGNOSIS — R05 Cough: Secondary | ICD-10-CM | POA: Diagnosis not present

## 2013-05-02 DIAGNOSIS — A419 Sepsis, unspecified organism: Secondary | ICD-10-CM | POA: Diagnosis not present

## 2013-05-02 DIAGNOSIS — R1110 Vomiting, unspecified: Secondary | ICD-10-CM | POA: Diagnosis not present

## 2013-05-02 LAB — RAPID INFLUENZA A&B ANTIGENS

## 2013-05-03 ENCOUNTER — Inpatient Hospital Stay: Payer: Self-pay | Admitting: Internal Medicine

## 2013-05-03 DIAGNOSIS — E039 Hypothyroidism, unspecified: Secondary | ICD-10-CM | POA: Diagnosis not present

## 2013-05-03 DIAGNOSIS — Z8571 Personal history of Hodgkin lymphoma: Secondary | ICD-10-CM | POA: Diagnosis not present

## 2013-05-03 DIAGNOSIS — Z87891 Personal history of nicotine dependence: Secondary | ICD-10-CM | POA: Diagnosis not present

## 2013-05-03 DIAGNOSIS — S99919A Unspecified injury of unspecified ankle, initial encounter: Secondary | ICD-10-CM | POA: Diagnosis not present

## 2013-05-03 DIAGNOSIS — F329 Major depressive disorder, single episode, unspecified: Secondary | ICD-10-CM | POA: Diagnosis present

## 2013-05-03 DIAGNOSIS — F3289 Other specified depressive episodes: Secondary | ICD-10-CM | POA: Diagnosis present

## 2013-05-03 DIAGNOSIS — R509 Fever, unspecified: Secondary | ICD-10-CM | POA: Diagnosis not present

## 2013-05-03 DIAGNOSIS — Z452 Encounter for adjustment and management of vascular access device: Secondary | ICD-10-CM | POA: Diagnosis not present

## 2013-05-03 DIAGNOSIS — E876 Hypokalemia: Secondary | ICD-10-CM | POA: Diagnosis present

## 2013-05-03 DIAGNOSIS — M2550 Pain in unspecified joint: Secondary | ICD-10-CM | POA: Diagnosis present

## 2013-05-03 DIAGNOSIS — R05 Cough: Secondary | ICD-10-CM | POA: Diagnosis not present

## 2013-05-03 DIAGNOSIS — R6510 Systemic inflammatory response syndrome (SIRS) of non-infectious origin without acute organ dysfunction: Secondary | ICD-10-CM | POA: Diagnosis not present

## 2013-05-03 DIAGNOSIS — M25569 Pain in unspecified knee: Secondary | ICD-10-CM | POA: Diagnosis not present

## 2013-05-03 DIAGNOSIS — Z6834 Body mass index (BMI) 34.0-34.9, adult: Secondary | ICD-10-CM | POA: Diagnosis not present

## 2013-05-03 DIAGNOSIS — A419 Sepsis, unspecified organism: Secondary | ICD-10-CM | POA: Diagnosis not present

## 2013-05-03 DIAGNOSIS — S8990XA Unspecified injury of unspecified lower leg, initial encounter: Secondary | ICD-10-CM | POA: Diagnosis not present

## 2013-05-03 DIAGNOSIS — R0989 Other specified symptoms and signs involving the circulatory and respiratory systems: Secondary | ICD-10-CM | POA: Diagnosis not present

## 2013-05-03 DIAGNOSIS — E669 Obesity, unspecified: Secondary | ICD-10-CM | POA: Diagnosis present

## 2013-05-03 DIAGNOSIS — Z9089 Acquired absence of other organs: Secondary | ICD-10-CM | POA: Diagnosis not present

## 2013-05-03 DIAGNOSIS — R0602 Shortness of breath: Secondary | ICD-10-CM | POA: Diagnosis not present

## 2013-05-03 DIAGNOSIS — G894 Chronic pain syndrome: Secondary | ICD-10-CM | POA: Diagnosis present

## 2013-05-03 DIAGNOSIS — R062 Wheezing: Secondary | ICD-10-CM | POA: Diagnosis not present

## 2013-05-03 DIAGNOSIS — R059 Cough, unspecified: Secondary | ICD-10-CM | POA: Diagnosis not present

## 2013-05-03 DIAGNOSIS — E785 Hyperlipidemia, unspecified: Secondary | ICD-10-CM | POA: Diagnosis not present

## 2013-05-03 DIAGNOSIS — F411 Generalized anxiety disorder: Secondary | ICD-10-CM | POA: Diagnosis present

## 2013-05-03 DIAGNOSIS — K219 Gastro-esophageal reflux disease without esophagitis: Secondary | ICD-10-CM | POA: Diagnosis present

## 2013-05-03 DIAGNOSIS — G8220 Paraplegia, unspecified: Secondary | ICD-10-CM | POA: Diagnosis not present

## 2013-05-03 DIAGNOSIS — J029 Acute pharyngitis, unspecified: Secondary | ICD-10-CM | POA: Diagnosis not present

## 2013-05-03 DIAGNOSIS — Q8909 Congenital malformations of spleen: Secondary | ICD-10-CM | POA: Diagnosis not present

## 2013-05-03 DIAGNOSIS — G609 Hereditary and idiopathic neuropathy, unspecified: Secondary | ICD-10-CM | POA: Diagnosis present

## 2013-05-03 LAB — URINALYSIS, COMPLETE
BILIRUBIN, UR: NEGATIVE
Bacteria: NONE SEEN
Glucose,UR: NEGATIVE mg/dL (ref 0–75)
KETONE: NEGATIVE
LEUKOCYTE ESTERASE: NEGATIVE
Nitrite: NEGATIVE
Ph: 5 (ref 4.5–8.0)
Protein: NEGATIVE
Specific Gravity: 1.017 (ref 1.003–1.030)
Squamous Epithelial: 1
WBC UR: <1 /HPF (ref 0–5)

## 2013-05-03 LAB — CBC WITH DIFFERENTIAL/PLATELET
Basophil #: 0.2 10*3/uL — ABNORMAL HIGH (ref 0.0–0.1)
Basophil %: 1.2 %
Eosinophil #: 0 10*3/uL (ref 0.0–0.7)
Eosinophil %: 0.3 %
HCT: 34.3 % — ABNORMAL LOW (ref 35.0–47.0)
HGB: 11.3 g/dL — ABNORMAL LOW (ref 12.0–16.0)
Lymphocyte #: 1.6 10*3/uL (ref 1.0–3.6)
Lymphocyte %: 8.6 %
MCH: 31.4 pg (ref 26.0–34.0)
MCHC: 32.8 g/dL (ref 32.0–36.0)
MCV: 96 fL (ref 80–100)
Monocyte #: 0.7 x10 3/mm (ref 0.2–0.9)
Monocyte %: 3.9 %
NEUTROS PCT: 86 %
Neutrophil #: 15.6 10*3/uL — ABNORMAL HIGH (ref 1.4–6.5)
PLATELETS: 344 10*3/uL (ref 150–440)
RBC: 3.59 10*6/uL — AB (ref 3.80–5.20)
RDW: 14.1 % (ref 11.5–14.5)
WBC: 18.1 10*3/uL — AB (ref 3.6–11.0)

## 2013-05-03 LAB — COMPREHENSIVE METABOLIC PANEL
ALBUMIN: 3 g/dL — AB (ref 3.4–5.0)
ALT: 23 U/L (ref 12–78)
ANION GAP: 6 — AB (ref 7–16)
AST: 18 U/L (ref 15–37)
Alkaline Phosphatase: 85 U/L
BUN: 17 mg/dL (ref 7–18)
Bilirubin,Total: 0.3 mg/dL (ref 0.2–1.0)
CALCIUM: 8.1 mg/dL — AB (ref 8.5–10.1)
CHLORIDE: 103 mmol/L (ref 98–107)
CO2: 27 mmol/L (ref 21–32)
CREATININE: 0.88 mg/dL (ref 0.60–1.30)
EGFR (Non-African Amer.): >60
GLUCOSE: 95 mg/dL (ref 65–99)
Osmolality: 273 (ref 275–301)
Potassium: 3.7 mmol/L (ref 3.5–5.1)
Sodium: 136 mmol/L (ref 136–145)
Total Protein: 6.8 g/dL (ref 6.4–8.2)

## 2013-05-04 LAB — CBC WITH DIFFERENTIAL/PLATELET
Basophil #: 0.1 10*3/uL (ref 0.0–0.1)
Basophil %: 0.8 %
EOS PCT: 2.6 %
Eosinophil #: 0.4 10*3/uL (ref 0.0–0.7)
HCT: 30.6 % — ABNORMAL LOW (ref 35.0–47.0)
HGB: 10.1 g/dL — ABNORMAL LOW (ref 12.0–16.0)
LYMPHS PCT: 17.7 %
Lymphocyte #: 2.7 10*3/uL (ref 1.0–3.6)
MCH: 31.7 pg (ref 26.0–34.0)
MCHC: 33 g/dL (ref 32.0–36.0)
MCV: 96 fL (ref 80–100)
MONO ABS: 1 x10 3/mm — AB (ref 0.2–0.9)
Monocyte %: 6.7 %
NEUTROS ABS: 11.1 10*3/uL — AB (ref 1.4–6.5)
NEUTROS PCT: 72.2 %
Platelet: 286 10*3/uL (ref 150–440)
RBC: 3.19 10*6/uL — ABNORMAL LOW (ref 3.80–5.20)
RDW: 14.6 % — AB (ref 11.5–14.5)
WBC: 15.3 10*3/uL — ABNORMAL HIGH (ref 3.6–11.0)

## 2013-05-04 LAB — VANCOMYCIN, TROUGH: Vancomycin, Trough: 14 ug/mL (ref 10–20)

## 2013-05-04 LAB — BASIC METABOLIC PANEL
ANION GAP: 5 — AB (ref 7–16)
BUN: 7 mg/dL (ref 7–18)
CHLORIDE: 109 mmol/L — AB (ref 98–107)
CREATININE: 0.66 mg/dL (ref 0.60–1.30)
Calcium, Total: 7.8 mg/dL — ABNORMAL LOW (ref 8.5–10.1)
Co2: 25 mmol/L (ref 21–32)
EGFR (African American): >60
EGFR (Non-African Amer.): >60
Glucose: 95 mg/dL (ref 65–99)
OSMOLALITY: 275 (ref 275–301)
POTASSIUM: 3.4 mmol/L — AB (ref 3.5–5.1)
Sodium: 139 mmol/L (ref 136–145)

## 2013-05-04 LAB — MAGNESIUM: Magnesium: 1.3 mg/dL — ABNORMAL LOW

## 2013-05-05 LAB — CBC WITH DIFFERENTIAL/PLATELET
BASOS ABS: 0.1 10*3/uL (ref 0.0–0.1)
BASOS PCT: 1 %
EOS PCT: 5.3 %
Eosinophil #: 0.5 10*3/uL (ref 0.0–0.7)
HCT: 29.7 % — ABNORMAL LOW (ref 35.0–47.0)
HGB: 9.8 g/dL — ABNORMAL LOW (ref 12.0–16.0)
LYMPHS ABS: 3.4 10*3/uL (ref 1.0–3.6)
Lymphocyte %: 35.6 %
MCH: 31.6 pg (ref 26.0–34.0)
MCHC: 33.1 g/dL (ref 32.0–36.0)
MCV: 95 fL (ref 80–100)
Monocyte #: 0.9 x10 3/mm (ref 0.2–0.9)
Monocyte %: 9.6 %
NEUTROS PCT: 48.5 %
Neutrophil #: 4.7 10*3/uL (ref 1.4–6.5)
Platelet: 283 10*3/uL (ref 150–440)
RBC: 3.12 10*6/uL — ABNORMAL LOW (ref 3.80–5.20)
RDW: 14 % (ref 11.5–14.5)
WBC: 9.6 10*3/uL (ref 3.6–11.0)

## 2013-05-05 LAB — BASIC METABOLIC PANEL
ANION GAP: 2 — AB (ref 7–16)
BUN: 4 mg/dL — AB (ref 7–18)
CO2: 28 mmol/L (ref 21–32)
Calcium, Total: 7.8 mg/dL — ABNORMAL LOW (ref 8.5–10.1)
Chloride: 110 mmol/L — ABNORMAL HIGH (ref 98–107)
Creatinine: 0.63 mg/dL (ref 0.60–1.30)
Glucose: 89 mg/dL (ref 65–99)
OSMOLALITY: 276 (ref 275–301)
POTASSIUM: 3.8 mmol/L (ref 3.5–5.1)
Sodium: 140 mmol/L (ref 136–145)

## 2013-05-05 LAB — RAPID HIV-1/2 QL/CONFIRM: HIV-1/2,Rapid Ql: NEGATIVE

## 2013-05-05 LAB — MAGNESIUM: Magnesium: 1.4 mg/dL — ABNORMAL LOW

## 2013-05-06 LAB — POTASSIUM: Potassium: 3.9 mmol/L (ref 3.5–5.1)

## 2013-05-06 LAB — WBC: WBC: 9.3 10*3/uL (ref 3.6–11.0)

## 2013-05-06 LAB — MAGNESIUM: MAGNESIUM: 1.2 mg/dL — AB

## 2013-05-08 LAB — CULTURE, BLOOD (SINGLE)

## 2013-05-15 DIAGNOSIS — Q8909 Congenital malformations of spleen: Secondary | ICD-10-CM | POA: Diagnosis not present

## 2013-05-15 DIAGNOSIS — R509 Fever, unspecified: Secondary | ICD-10-CM | POA: Diagnosis not present

## 2013-05-18 DIAGNOSIS — Z5189 Encounter for other specified aftercare: Secondary | ICD-10-CM | POA: Diagnosis not present

## 2013-05-18 DIAGNOSIS — M216X9 Other acquired deformities of unspecified foot: Secondary | ICD-10-CM | POA: Diagnosis not present

## 2013-05-23 DIAGNOSIS — Z87898 Personal history of other specified conditions: Secondary | ICD-10-CM | POA: Diagnosis not present

## 2013-05-23 DIAGNOSIS — M216X9 Other acquired deformities of unspecified foot: Secondary | ICD-10-CM | POA: Diagnosis not present

## 2013-05-23 DIAGNOSIS — M5380 Other specified dorsopathies, site unspecified: Secondary | ICD-10-CM | POA: Diagnosis not present

## 2013-05-25 DIAGNOSIS — R002 Palpitations: Secondary | ICD-10-CM | POA: Diagnosis not present

## 2013-05-25 DIAGNOSIS — I4891 Unspecified atrial fibrillation: Secondary | ICD-10-CM | POA: Diagnosis not present

## 2013-05-25 DIAGNOSIS — R0602 Shortness of breath: Secondary | ICD-10-CM | POA: Diagnosis not present

## 2013-05-29 ENCOUNTER — Other Ambulatory Visit: Payer: Self-pay | Admitting: Internal Medicine

## 2013-05-29 NOTE — Telephone Encounter (Signed)
Appt 06/09/13

## 2013-06-01 DIAGNOSIS — M542 Cervicalgia: Secondary | ICD-10-CM | POA: Diagnosis not present

## 2013-06-01 DIAGNOSIS — J45909 Unspecified asthma, uncomplicated: Secondary | ICD-10-CM | POA: Diagnosis not present

## 2013-06-01 DIAGNOSIS — M6281 Muscle weakness (generalized): Secondary | ICD-10-CM | POA: Diagnosis not present

## 2013-06-01 DIAGNOSIS — Z9181 History of falling: Secondary | ICD-10-CM | POA: Diagnosis not present

## 2013-06-01 DIAGNOSIS — IMO0001 Reserved for inherently not codable concepts without codable children: Secondary | ICD-10-CM | POA: Diagnosis not present

## 2013-06-01 DIAGNOSIS — C8190 Hodgkin lymphoma, unspecified, unspecified site: Secondary | ICD-10-CM | POA: Diagnosis not present

## 2013-06-09 ENCOUNTER — Encounter: Payer: Self-pay | Admitting: Internal Medicine

## 2013-06-09 ENCOUNTER — Ambulatory Visit (INDEPENDENT_AMBULATORY_CARE_PROVIDER_SITE_OTHER): Payer: Medicare Other | Admitting: Internal Medicine

## 2013-06-09 VITALS — BP 120/70 | HR 103 | Temp 98.2°F

## 2013-06-09 DIAGNOSIS — M545 Low back pain, unspecified: Secondary | ICD-10-CM | POA: Diagnosis not present

## 2013-06-09 DIAGNOSIS — Z23 Encounter for immunization: Secondary | ICD-10-CM | POA: Diagnosis not present

## 2013-06-09 DIAGNOSIS — R937 Abnormal findings on diagnostic imaging of other parts of musculoskeletal system: Secondary | ICD-10-CM | POA: Diagnosis not present

## 2013-06-09 DIAGNOSIS — IMO0001 Reserved for inherently not codable concepts without codable children: Secondary | ICD-10-CM | POA: Diagnosis not present

## 2013-06-09 DIAGNOSIS — M542 Cervicalgia: Secondary | ICD-10-CM | POA: Diagnosis not present

## 2013-06-09 DIAGNOSIS — Z9081 Acquired absence of spleen: Secondary | ICD-10-CM

## 2013-06-09 DIAGNOSIS — J45909 Unspecified asthma, uncomplicated: Secondary | ICD-10-CM | POA: Diagnosis not present

## 2013-06-09 DIAGNOSIS — M6281 Muscle weakness (generalized): Secondary | ICD-10-CM | POA: Diagnosis not present

## 2013-06-09 DIAGNOSIS — C8190 Hodgkin lymphoma, unspecified, unspecified site: Secondary | ICD-10-CM | POA: Diagnosis not present

## 2013-06-09 DIAGNOSIS — Z9089 Acquired absence of other organs: Secondary | ICD-10-CM

## 2013-06-09 DIAGNOSIS — Z8571 Personal history of Hodgkin lymphoma: Secondary | ICD-10-CM

## 2013-06-09 MED ORDER — PANTOPRAZOLE SODIUM 40 MG PO TBEC: DELAYED_RELEASE_TABLET | ORAL | Status: DC

## 2013-06-09 MED ORDER — OXYCODONE-ACETAMINOPHEN 5-325 MG PO TABS: 1.0000 | ORAL_TABLET | Freq: Four times a day (QID) | ORAL | Status: DC | PRN

## 2013-06-09 MED ORDER — ALBUTEROL SULFATE HFA 108 (90 BASE) MCG/ACT IN AERS
2.0000 | INHALATION_SPRAY | Freq: Four times a day (QID) | RESPIRATORY_TRACT | Status: DC | PRN
Start: 2013-06-09 — End: 2014-03-05

## 2013-06-09 MED ORDER — SYNTHROID 125 MCG PO TABS: ORAL_TABLET | ORAL | Status: DC

## 2013-06-09 MED ORDER — DULOXETINE HCL 60 MG PO CPEP: 60.0000 mg | ORAL_CAPSULE | Freq: Every day | ORAL | Status: DC

## 2013-06-09 NOTE — Progress Notes (Signed)
Subjective:    Patient ID: Teresa [name], female    DOB: 07-27-1965, 48 y.o.   MRN: [id number]  HPI 48YO female with h/o Hodgkin's lymphoma presents for hospital follow up.  Jan 2014 developed flu. Feb had pneumonia. Went to [facility] ED, treated with Levaquin.  Doing better until about March 3rd. Developed hypotension, fever 103F. Diagnosed with septic shock. Admitted at [facility]. In ICU for several days. Had strep in blood cultures. Discharged March 8th and treated with additional Levaquin. Had reaction to Dilaudid after discharge.  Had poor energy until recently. Over last few weeks developed cough again. Dry cough. No fever or chills.  Followed by Dr. Ola Spurr and Dr. Josefa Half at [facility].  Had a Holter and ECHO and has follow up next week. Seen by neurology at [facility]. 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 Scheduled to have evaluation with Dr. Gerald Dexter in Oncology with lumbar puncture.      Review of Systems  Constitutional: Positive for fatigue. Negative for fever, chills, appetite change and unexpected weight change.  HENT: Negative for congestion, ear pain, sinus pressure, sore throat, trouble swallowing and voice change.   Eyes: Negative for visual disturbance.  Respiratory: Positive for cough. Negative for shortness of breath, wheezing and stridor.   Cardiovascular: Negative for chest pain, palpitations and leg swelling.  Gastrointestinal: Negative for nausea, vomiting, abdominal pain, diarrhea, constipation, blood in stool, abdominal distention and anal bleeding.  Genitourinary: Negative for dysuria and flank pain.  Musculoskeletal: Positive for arthralgias, back pain and myalgias. Negative for gait problem and neck pain.  Skin: Negative for color change and rash.  Neurological: Negative for dizziness and headaches.  Hematological: Negative for adenopathy. Does not bruise/bleed easily.  Psychiatric/Behavioral: Negative for suicidal ideas, sleep  disturbance and dysphoric mood. The patient is not nervous/anxious.        Objective:    BP 120/70  Pulse 103  Temp(Src) 98.2 F (36.8 C) (Oral)  SpO2 95%  LMP 03/20/2001 Physical Exam  Constitutional: She is oriented to person, place, and time. She appears well-developed and well-nourished. No distress.  HENT:  Head: Normocephalic and atraumatic.  Right Ear: External ear normal.  Left Ear: External ear normal.  Nose: Nose normal.  Mouth/Throat: Oropharynx is clear and moist. No oropharyngeal exudate.  Eyes: Conjunctivae are normal. Pupils are equal, round, and reactive to light. Right eye exhibits no discharge. Left eye exhibits no discharge. No scleral icterus.  Neck: Normal range of motion. Neck supple. No tracheal deviation present. No thyromegaly present.  Cardiovascular: Normal rate, regular rhythm, normal heart sounds and intact distal pulses.  Exam reveals no gallop and no friction rub.   No murmur heard. Pulmonary/Chest: Effort normal and breath sounds normal. No accessory muscle usage. Not tachypneic. No respiratory distress. She has no decreased breath sounds. She has no wheezes. She has no rhonchi. She has no rales. She exhibits no tenderness.  Musculoskeletal: Normal range of motion. She exhibits no edema and no tenderness.       Thoracic back: She exhibits pain.       Lumbar back: She exhibits pain.  Lymphadenopathy:    She has cervical adenopathy.       Right cervical: Superficial cervical adenopathy present.       Left cervical: Superficial cervical adenopathy present.  Neurological: She is alert and oriented to person, place, and time. No cranial nerve deficit. She exhibits normal muscle tone. Coordination normal.  Skin: Skin is warm and dry. No rash noted.  She is not diaphoretic. No erythema. No pallor.  Psychiatric: She has a normal mood and affect. Her behavior is normal. Judgment and thought content normal.          Assessment & Plan:  Over 68min of  which >50% spent in face-to-face contact with patient discussing plan of care   Problem List Items Addressed This Visit   Abnormal MRI, lumbar spine     Reviewed records on recent MRI of the lumbar spine from [facility]. MRI shows possible inflammation at the cauda equina versus recurrence of lymphoma. Patient is scheduled to see oncology in 2 weeks with lumbar puncture. Will follow.    History of hodgkin's lymphoma - Primary     Note that evaluation with oncology is pending given recent abnormal findings on MRI of the lumbar spine with possible recurrent disease.    Low back pain     Chronic low back pain secondary to degenerative arthritis with recent exacerbation with abnormal findings on MRI which are concerning for possible recurrence of lymphoma. Referral has been placed to pain management, however in the interim will use Percocet as needed for severe pain only.    Relevant Medications      oxyCODONE-acetaminophen (PERCOCET/ROXICET) 5-325 MG per tablet   S/P splenectomy     Discussed risks of infection in pt s/p splenectomy. Will give meningococcal vaccine today and prevnar at next visit.     Other Visit Diagnoses   Need for other specified prophylactic vaccination against single bacterial disease        Relevant Orders       Meningococcal conjugate vaccine 4-valent IM        Return in about 3 months (around 09/08/2013) for Physical.

## 2013-06-09 NOTE — Assessment & Plan Note (Signed)
Reviewed records on recent MRI of the lumbar spine from [facility]. MRI shows possible inflammation at the cauda equina versus recurrence of lymphoma. Patient is scheduled to see oncology in 2 weeks with lumbar puncture. Will follow.

## 2013-06-09 NOTE — Assessment & Plan Note (Signed)
Discussed risks of infection in pt s/p splenectomy. Will give meningococcal vaccine today and prevnar at next visit.

## 2013-06-09 NOTE — Assessment & Plan Note (Signed)
Note that evaluation with oncology is pending given recent abnormal findings on MRI of the lumbar spine with possible recurrent disease.

## 2013-06-09 NOTE — Progress Notes (Signed)
Pre visit review using our clinic review tool, if applicable. No additional management support is needed unless otherwise documented below in the visit note. 

## 2013-06-09 NOTE — Assessment & Plan Note (Signed)
Chronic low back pain secondary to degenerative arthritis with recent exacerbation with abnormal findings on MRI which are concerning for possible recurrence of lymphoma. Referral has been placed to pain management, however in the interim will use Percocet as needed for severe pain only.

## 2013-06-12 ENCOUNTER — Telehealth: Payer: Self-pay | Admitting: Internal Medicine

## 2013-06-12 DIAGNOSIS — I359 Nonrheumatic aortic valve disorder, unspecified: Secondary | ICD-10-CM | POA: Diagnosis not present

## 2013-06-12 DIAGNOSIS — R002 Palpitations: Secondary | ICD-10-CM | POA: Diagnosis not present

## 2013-06-12 DIAGNOSIS — R0609 Other forms of dyspnea: Secondary | ICD-10-CM | POA: Diagnosis not present

## 2013-06-12 NOTE — Telephone Encounter (Signed)
Relevant patient education assigned to patient using Emmi. ° °

## 2013-06-13 DIAGNOSIS — M6281 Muscle weakness (generalized): Secondary | ICD-10-CM | POA: Diagnosis not present

## 2013-06-13 DIAGNOSIS — C8190 Hodgkin lymphoma, unspecified, unspecified site: Secondary | ICD-10-CM

## 2013-06-13 DIAGNOSIS — IMO0001 Reserved for inherently not codable concepts without codable children: Secondary | ICD-10-CM

## 2013-06-14 DIAGNOSIS — IMO0001 Reserved for inherently not codable concepts without codable children: Secondary | ICD-10-CM | POA: Diagnosis not present

## 2013-06-14 DIAGNOSIS — M542 Cervicalgia: Secondary | ICD-10-CM | POA: Diagnosis not present

## 2013-06-14 DIAGNOSIS — C8190 Hodgkin lymphoma, unspecified, unspecified site: Secondary | ICD-10-CM | POA: Diagnosis not present

## 2013-06-14 DIAGNOSIS — J45909 Unspecified asthma, uncomplicated: Secondary | ICD-10-CM | POA: Diagnosis not present

## 2013-06-14 DIAGNOSIS — M6281 Muscle weakness (generalized): Secondary | ICD-10-CM | POA: Diagnosis not present

## 2013-06-15 DIAGNOSIS — M542 Cervicalgia: Secondary | ICD-10-CM | POA: Diagnosis not present

## 2013-06-15 DIAGNOSIS — J45909 Unspecified asthma, uncomplicated: Secondary | ICD-10-CM | POA: Diagnosis not present

## 2013-06-15 DIAGNOSIS — C8190 Hodgkin lymphoma, unspecified, unspecified site: Secondary | ICD-10-CM | POA: Diagnosis not present

## 2013-06-15 DIAGNOSIS — IMO0001 Reserved for inherently not codable concepts without codable children: Secondary | ICD-10-CM | POA: Diagnosis not present

## 2013-06-15 DIAGNOSIS — M6281 Muscle weakness (generalized): Secondary | ICD-10-CM | POA: Diagnosis not present

## 2013-06-20 DIAGNOSIS — J45909 Unspecified asthma, uncomplicated: Secondary | ICD-10-CM | POA: Diagnosis not present

## 2013-06-20 DIAGNOSIS — IMO0001 Reserved for inherently not codable concepts without codable children: Secondary | ICD-10-CM | POA: Diagnosis not present

## 2013-06-20 DIAGNOSIS — C8190 Hodgkin lymphoma, unspecified, unspecified site: Secondary | ICD-10-CM | POA: Diagnosis not present

## 2013-06-20 DIAGNOSIS — M542 Cervicalgia: Secondary | ICD-10-CM | POA: Diagnosis not present

## 2013-06-20 DIAGNOSIS — M6281 Muscle weakness (generalized): Secondary | ICD-10-CM | POA: Diagnosis not present

## 2013-06-27 DIAGNOSIS — C8190 Hodgkin lymphoma, unspecified, unspecified site: Secondary | ICD-10-CM | POA: Diagnosis not present

## 2013-06-27 DIAGNOSIS — Z8571 Personal history of Hodgkin lymphoma: Secondary | ICD-10-CM | POA: Diagnosis not present

## 2013-06-27 DIAGNOSIS — R599 Enlarged lymph nodes, unspecified: Secondary | ICD-10-CM | POA: Diagnosis not present

## 2013-06-28 ENCOUNTER — Other Ambulatory Visit: Payer: Self-pay | Admitting: Internal Medicine

## 2013-06-28 DIAGNOSIS — M6281 Muscle weakness (generalized): Secondary | ICD-10-CM | POA: Diagnosis not present

## 2013-06-28 DIAGNOSIS — C8190 Hodgkin lymphoma, unspecified, unspecified site: Secondary | ICD-10-CM | POA: Diagnosis not present

## 2013-06-28 DIAGNOSIS — J45909 Unspecified asthma, uncomplicated: Secondary | ICD-10-CM | POA: Diagnosis not present

## 2013-06-28 DIAGNOSIS — IMO0001 Reserved for inherently not codable concepts without codable children: Secondary | ICD-10-CM | POA: Diagnosis not present

## 2013-06-28 DIAGNOSIS — M542 Cervicalgia: Secondary | ICD-10-CM | POA: Diagnosis not present

## 2013-07-05 DIAGNOSIS — Z79899 Other long term (current) drug therapy: Secondary | ICD-10-CM | POA: Diagnosis not present

## 2013-07-05 DIAGNOSIS — R948 Abnormal results of function studies of other organs and systems: Secondary | ICD-10-CM | POA: Diagnosis not present

## 2013-07-05 DIAGNOSIS — Z923 Personal history of irradiation: Secondary | ICD-10-CM | POA: Diagnosis not present

## 2013-07-05 DIAGNOSIS — J352 Hypertrophy of adenoids: Secondary | ICD-10-CM | POA: Diagnosis not present

## 2013-07-05 DIAGNOSIS — Z09 Encounter for follow-up examination after completed treatment for conditions other than malignant neoplasm: Secondary | ICD-10-CM | POA: Diagnosis not present

## 2013-07-05 DIAGNOSIS — R599 Enlarged lymph nodes, unspecified: Secondary | ICD-10-CM | POA: Diagnosis not present

## 2013-07-05 DIAGNOSIS — C8190 Hodgkin lymphoma, unspecified, unspecified site: Secondary | ICD-10-CM | POA: Diagnosis not present

## 2013-07-05 DIAGNOSIS — R918 Other nonspecific abnormal finding of lung field: Secondary | ICD-10-CM | POA: Diagnosis not present

## 2013-07-05 DIAGNOSIS — N2889 Other specified disorders of kidney and ureter: Secondary | ICD-10-CM | POA: Diagnosis not present

## 2013-07-05 DIAGNOSIS — Z8571 Personal history of Hodgkin lymphoma: Secondary | ICD-10-CM | POA: Diagnosis not present

## 2013-07-11 DIAGNOSIS — N281 Cyst of kidney, acquired: Secondary | ICD-10-CM | POA: Diagnosis not present

## 2013-07-11 DIAGNOSIS — J359 Chronic disease of tonsils and adenoids, unspecified: Secondary | ICD-10-CM | POA: Diagnosis not present

## 2013-07-11 DIAGNOSIS — Z8571 Personal history of Hodgkin lymphoma: Secondary | ICD-10-CM | POA: Diagnosis not present

## 2013-07-11 DIAGNOSIS — N289 Disorder of kidney and ureter, unspecified: Secondary | ICD-10-CM | POA: Diagnosis not present

## 2013-07-11 DIAGNOSIS — R918 Other nonspecific abnormal finding of lung field: Secondary | ICD-10-CM | POA: Diagnosis not present

## 2013-07-11 DIAGNOSIS — Z9221 Personal history of antineoplastic chemotherapy: Secondary | ICD-10-CM | POA: Diagnosis not present

## 2013-07-11 DIAGNOSIS — N2889 Other specified disorders of kidney and ureter: Secondary | ICD-10-CM | POA: Diagnosis not present

## 2013-07-11 DIAGNOSIS — Q618 Other cystic kidney diseases: Secondary | ICD-10-CM | POA: Diagnosis not present

## 2013-07-11 DIAGNOSIS — Z923 Personal history of irradiation: Secondary | ICD-10-CM | POA: Diagnosis not present

## 2013-07-12 DIAGNOSIS — J45909 Unspecified asthma, uncomplicated: Secondary | ICD-10-CM | POA: Diagnosis not present

## 2013-07-12 DIAGNOSIS — M6281 Muscle weakness (generalized): Secondary | ICD-10-CM | POA: Diagnosis not present

## 2013-07-12 DIAGNOSIS — C8190 Hodgkin lymphoma, unspecified, unspecified site: Secondary | ICD-10-CM | POA: Diagnosis not present

## 2013-07-12 DIAGNOSIS — IMO0001 Reserved for inherently not codable concepts without codable children: Secondary | ICD-10-CM | POA: Diagnosis not present

## 2013-07-12 DIAGNOSIS — M542 Cervicalgia: Secondary | ICD-10-CM | POA: Diagnosis not present

## 2013-08-07 DIAGNOSIS — M25579 Pain in unspecified ankle and joints of unspecified foot: Secondary | ICD-10-CM | POA: Diagnosis not present

## 2013-09-18 DIAGNOSIS — M25579 Pain in unspecified ankle and joints of unspecified foot: Secondary | ICD-10-CM | POA: Diagnosis not present

## 2013-10-11 ENCOUNTER — Ambulatory Visit: Payer: Self-pay | Admitting: Orthopedic Surgery

## 2013-10-11 DIAGNOSIS — Z01812 Encounter for preprocedural laboratory examination: Secondary | ICD-10-CM | POA: Diagnosis not present

## 2013-10-11 DIAGNOSIS — M25579 Pain in unspecified ankle and joints of unspecified foot: Secondary | ICD-10-CM | POA: Diagnosis not present

## 2013-10-11 DIAGNOSIS — R42 Dizziness and giddiness: Secondary | ICD-10-CM | POA: Diagnosis not present

## 2013-10-11 DIAGNOSIS — Z889 Allergy status to unspecified drugs, medicaments and biological substances status: Secondary | ICD-10-CM | POA: Diagnosis not present

## 2013-10-11 DIAGNOSIS — K219 Gastro-esophageal reflux disease without esophagitis: Secondary | ICD-10-CM | POA: Diagnosis not present

## 2013-10-11 DIAGNOSIS — I4891 Unspecified atrial fibrillation: Secondary | ICD-10-CM | POA: Diagnosis not present

## 2013-10-11 DIAGNOSIS — F411 Generalized anxiety disorder: Secondary | ICD-10-CM | POA: Diagnosis not present

## 2013-10-11 DIAGNOSIS — K3189 Other diseases of stomach and duodenum: Secondary | ICD-10-CM | POA: Diagnosis not present

## 2013-10-11 DIAGNOSIS — R1013 Epigastric pain: Secondary | ICD-10-CM | POA: Diagnosis not present

## 2013-10-11 DIAGNOSIS — G43909 Migraine, unspecified, not intractable, without status migrainosus: Secondary | ICD-10-CM | POA: Diagnosis not present

## 2013-10-11 LAB — BASIC METABOLIC PANEL
ANION GAP: 7 (ref 7–16)
BUN: 13 mg/dL (ref 7–18)
CALCIUM: 8.9 mg/dL (ref 8.5–10.1)
CO2: 29 mmol/L (ref 21–32)
Chloride: 104 mmol/L (ref 98–107)
Creatinine: 0.91 mg/dL (ref 0.60–1.30)
EGFR (African American): >60
EGFR (Non-African Amer.): >60
Glucose: 85 mg/dL (ref 65–99)
OSMOLALITY: 279 (ref 275–301)
POTASSIUM: 4.1 mmol/L (ref 3.5–5.1)
Sodium: 140 mmol/L (ref 136–145)

## 2013-10-11 LAB — CBC WITH DIFFERENTIAL/PLATELET
BASOS PCT: 0.2 %
Basophil #: 0 10*3/uL (ref 0.0–0.1)
Eosinophil #: 0.3 10*3/uL (ref 0.0–0.7)
Eosinophil %: 3.2 %
HCT: 40 % (ref 35.0–47.0)
HGB: 13 g/dL (ref 12.0–16.0)
LYMPHS PCT: 35.5 %
Lymphocyte #: 3.1 10*3/uL (ref 1.0–3.6)
MCH: 31.1 pg (ref 26.0–34.0)
MCHC: 32.4 g/dL (ref 32.0–36.0)
MCV: 96 fL (ref 80–100)
MONOS PCT: 7.7 %
Monocyte #: 0.7 x10 3/mm (ref 0.2–0.9)
NEUTROS ABS: 4.7 10*3/uL (ref 1.4–6.5)
Neutrophil %: 53.4 %
Platelet: 382 10*3/uL (ref 150–440)
RBC: 4.16 10*6/uL (ref 3.80–5.20)
RDW: 14.8 % — AB (ref 11.5–14.5)
WBC: 8.9 10*3/uL (ref 3.6–11.0)

## 2013-10-18 ENCOUNTER — Ambulatory Visit: Payer: Self-pay | Admitting: Orthopedic Surgery

## 2013-10-18 DIAGNOSIS — R42 Dizziness and giddiness: Secondary | ICD-10-CM | POA: Diagnosis not present

## 2013-10-18 DIAGNOSIS — E039 Hypothyroidism, unspecified: Secondary | ICD-10-CM | POA: Diagnosis not present

## 2013-10-18 DIAGNOSIS — T84498A Other mechanical complication of other internal orthopedic devices, implants and grafts, initial encounter: Secondary | ICD-10-CM | POA: Diagnosis not present

## 2013-10-18 DIAGNOSIS — F411 Generalized anxiety disorder: Secondary | ICD-10-CM | POA: Diagnosis not present

## 2013-10-18 DIAGNOSIS — Z9109 Other allergy status, other than to drugs and biological substances: Secondary | ICD-10-CM | POA: Diagnosis not present

## 2013-10-18 DIAGNOSIS — M25579 Pain in unspecified ankle and joints of unspecified foot: Secondary | ICD-10-CM | POA: Diagnosis not present

## 2013-10-18 DIAGNOSIS — Z472 Encounter for removal of internal fixation device: Secondary | ICD-10-CM | POA: Diagnosis not present

## 2013-10-18 DIAGNOSIS — Z881 Allergy status to other antibiotic agents status: Secondary | ICD-10-CM | POA: Diagnosis not present

## 2013-10-18 DIAGNOSIS — Z9089 Acquired absence of other organs: Secondary | ICD-10-CM | POA: Diagnosis not present

## 2013-10-18 DIAGNOSIS — M129 Arthropathy, unspecified: Secondary | ICD-10-CM | POA: Diagnosis not present

## 2013-10-18 DIAGNOSIS — F3289 Other specified depressive episodes: Secondary | ICD-10-CM | POA: Diagnosis not present

## 2013-10-18 DIAGNOSIS — Z880 Allergy status to penicillin: Secondary | ICD-10-CM | POA: Diagnosis not present

## 2013-10-18 DIAGNOSIS — I700 Atherosclerosis of aorta: Secondary | ICD-10-CM | POA: Diagnosis not present

## 2013-10-18 DIAGNOSIS — Z8249 Family history of ischemic heart disease and other diseases of the circulatory system: Secondary | ICD-10-CM | POA: Diagnosis not present

## 2013-10-18 DIAGNOSIS — K3189 Other diseases of stomach and duodenum: Secondary | ICD-10-CM | POA: Diagnosis not present

## 2013-10-18 DIAGNOSIS — Z885 Allergy status to narcotic agent status: Secondary | ICD-10-CM | POA: Diagnosis not present

## 2013-10-18 DIAGNOSIS — K219 Gastro-esophageal reflux disease without esophagitis: Secondary | ICD-10-CM | POA: Diagnosis not present

## 2013-10-18 DIAGNOSIS — G43909 Migraine, unspecified, not intractable, without status migrainosus: Secondary | ICD-10-CM | POA: Diagnosis not present

## 2013-10-18 DIAGNOSIS — Z888 Allergy status to other drugs, medicaments and biological substances status: Secondary | ICD-10-CM | POA: Diagnosis not present

## 2013-10-18 DIAGNOSIS — Z8541 Personal history of malignant neoplasm of cervix uteri: Secondary | ICD-10-CM | POA: Diagnosis not present

## 2013-10-18 DIAGNOSIS — Z79899 Other long term (current) drug therapy: Secondary | ICD-10-CM | POA: Diagnosis not present

## 2013-10-18 DIAGNOSIS — M549 Dorsalgia, unspecified: Secondary | ICD-10-CM | POA: Diagnosis not present

## 2013-10-18 DIAGNOSIS — Z8571 Personal history of Hodgkin lymphoma: Secondary | ICD-10-CM | POA: Diagnosis not present

## 2013-10-18 DIAGNOSIS — Z87442 Personal history of urinary calculi: Secondary | ICD-10-CM | POA: Diagnosis not present

## 2013-10-18 DIAGNOSIS — Z800 Family history of malignant neoplasm of digestive organs: Secondary | ICD-10-CM | POA: Diagnosis not present

## 2013-10-18 DIAGNOSIS — F329 Major depressive disorder, single episode, unspecified: Secondary | ICD-10-CM | POA: Diagnosis not present

## 2013-10-18 DIAGNOSIS — R1013 Epigastric pain: Secondary | ICD-10-CM | POA: Diagnosis not present

## 2013-10-18 DIAGNOSIS — I4891 Unspecified atrial fibrillation: Secondary | ICD-10-CM | POA: Diagnosis not present

## 2013-10-23 ENCOUNTER — Inpatient Hospital Stay: Payer: Self-pay | Admitting: Internal Medicine

## 2013-10-23 DIAGNOSIS — E7800 Pure hypercholesterolemia, unspecified: Secondary | ICD-10-CM | POA: Diagnosis present

## 2013-10-23 DIAGNOSIS — R52 Pain, unspecified: Secondary | ICD-10-CM | POA: Diagnosis not present

## 2013-10-23 DIAGNOSIS — Z8571 Personal history of Hodgkin lymphoma: Secondary | ICD-10-CM | POA: Diagnosis not present

## 2013-10-23 DIAGNOSIS — Z87891 Personal history of nicotine dependence: Secondary | ICD-10-CM | POA: Diagnosis not present

## 2013-10-23 DIAGNOSIS — R5381 Other malaise: Secondary | ICD-10-CM | POA: Diagnosis not present

## 2013-10-23 DIAGNOSIS — F329 Major depressive disorder, single episode, unspecified: Secondary | ICD-10-CM | POA: Diagnosis present

## 2013-10-23 DIAGNOSIS — M79609 Pain in unspecified limb: Secondary | ICD-10-CM | POA: Diagnosis present

## 2013-10-23 DIAGNOSIS — Z9221 Personal history of antineoplastic chemotherapy: Secondary | ICD-10-CM | POA: Diagnosis not present

## 2013-10-23 DIAGNOSIS — F411 Generalized anxiety disorder: Secondary | ICD-10-CM | POA: Diagnosis present

## 2013-10-23 DIAGNOSIS — R5383 Other fatigue: Secondary | ICD-10-CM | POA: Diagnosis not present

## 2013-10-23 DIAGNOSIS — Z9089 Acquired absence of other organs: Secondary | ICD-10-CM | POA: Diagnosis not present

## 2013-10-23 DIAGNOSIS — K219 Gastro-esophageal reflux disease without esophagitis: Secondary | ICD-10-CM | POA: Diagnosis present

## 2013-10-23 DIAGNOSIS — G8929 Other chronic pain: Secondary | ICD-10-CM | POA: Diagnosis present

## 2013-10-23 DIAGNOSIS — N10 Acute tubulo-interstitial nephritis: Secondary | ICD-10-CM | POA: Diagnosis not present

## 2013-10-23 DIAGNOSIS — J984 Other disorders of lung: Secondary | ICD-10-CM | POA: Diagnosis not present

## 2013-10-23 DIAGNOSIS — Z87410 Personal history of cervical dysplasia: Secondary | ICD-10-CM | POA: Diagnosis not present

## 2013-10-23 DIAGNOSIS — R509 Fever, unspecified: Secondary | ICD-10-CM | POA: Diagnosis not present

## 2013-10-23 DIAGNOSIS — A419 Sepsis, unspecified organism: Secondary | ICD-10-CM | POA: Diagnosis not present

## 2013-10-23 DIAGNOSIS — Z923 Personal history of irradiation: Secondary | ICD-10-CM | POA: Diagnosis not present

## 2013-10-23 DIAGNOSIS — R079 Chest pain, unspecified: Secondary | ICD-10-CM | POA: Diagnosis not present

## 2013-10-23 DIAGNOSIS — R109 Unspecified abdominal pain: Secondary | ICD-10-CM | POA: Diagnosis not present

## 2013-10-23 DIAGNOSIS — R911 Solitary pulmonary nodule: Secondary | ICD-10-CM | POA: Diagnosis not present

## 2013-10-23 DIAGNOSIS — N390 Urinary tract infection, site not specified: Secondary | ICD-10-CM | POA: Diagnosis not present

## 2013-10-23 DIAGNOSIS — J189 Pneumonia, unspecified organism: Secondary | ICD-10-CM | POA: Diagnosis not present

## 2013-10-23 DIAGNOSIS — E039 Hypothyroidism, unspecified: Secondary | ICD-10-CM | POA: Diagnosis not present

## 2013-10-23 DIAGNOSIS — G8220 Paraplegia, unspecified: Secondary | ICD-10-CM | POA: Diagnosis not present

## 2013-10-23 DIAGNOSIS — F3289 Other specified depressive episodes: Secondary | ICD-10-CM | POA: Diagnosis present

## 2013-10-23 DIAGNOSIS — R918 Other nonspecific abnormal finding of lung field: Secondary | ICD-10-CM | POA: Diagnosis present

## 2013-10-23 DIAGNOSIS — E785 Hyperlipidemia, unspecified: Secondary | ICD-10-CM | POA: Diagnosis not present

## 2013-10-23 DIAGNOSIS — Z800 Family history of malignant neoplasm of digestive organs: Secondary | ICD-10-CM | POA: Diagnosis not present

## 2013-10-23 LAB — COMPREHENSIVE METABOLIC PANEL
ALBUMIN: 3.8 g/dL (ref 3.4–5.0)
ANION GAP: 7 (ref 7–16)
AST: 17 U/L (ref 15–37)
Alkaline Phosphatase: 77 U/L
BILIRUBIN TOTAL: 0.2 mg/dL (ref 0.2–1.0)
BUN: 8 mg/dL (ref 7–18)
CALCIUM: 8.6 mg/dL (ref 8.5–10.1)
CHLORIDE: 106 mmol/L (ref 98–107)
CO2: 27 mmol/L (ref 21–32)
CREATININE: 0.9 mg/dL (ref 0.60–1.30)
EGFR (Non-African Amer.): >60
GLUCOSE: 95 mg/dL (ref 65–99)
Osmolality: 278 (ref 275–301)
Potassium: 3.3 mmol/L — ABNORMAL LOW (ref 3.5–5.1)
SGPT (ALT): 23 U/L
SODIUM: 140 mmol/L (ref 136–145)
Total Protein: 8 g/dL (ref 6.4–8.2)

## 2013-10-23 LAB — CBC WITH DIFFERENTIAL/PLATELET
BASOS ABS: 0.2 10*3/uL — AB (ref 0.0–0.1)
Basophil %: 1.3 %
EOS ABS: 0.4 10*3/uL (ref 0.0–0.7)
Eosinophil %: 3.3 %
HCT: 40.5 % (ref 35.0–47.0)
HGB: 13.3 g/dL (ref 12.0–16.0)
LYMPHS PCT: 48.3 %
Lymphocyte #: 5.8 10*3/uL — ABNORMAL HIGH (ref 1.0–3.6)
MCH: 31.4 pg (ref 26.0–34.0)
MCHC: 32.9 g/dL (ref 32.0–36.0)
MCV: 95 fL (ref 80–100)
MONO ABS: 0.9 x10 3/mm (ref 0.2–0.9)
Monocyte %: 7.3 %
Neutrophil #: 4.8 10*3/uL (ref 1.4–6.5)
Neutrophil %: 39.8 %
PLATELETS: 347 10*3/uL (ref 150–440)
RBC: 4.25 10*6/uL (ref 3.80–5.20)
RDW: 14.9 % — ABNORMAL HIGH (ref 11.5–14.5)
WBC: 12 10*3/uL — ABNORMAL HIGH (ref 3.6–11.0)

## 2013-10-23 LAB — URINALYSIS, COMPLETE
BLOOD: NEGATIVE
Bilirubin,UR: NEGATIVE
Glucose,UR: NEGATIVE mg/dL (ref 0–75)
Ketone: NEGATIVE
Nitrite: NEGATIVE
PH: 5 (ref 4.5–8.0)
Protein: NEGATIVE
RBC,UR: 224 /HPF (ref 0–5)
Specific Gravity: 1.006 (ref 1.003–1.030)
WBC UR: 18 /HPF (ref 0–5)

## 2013-10-23 LAB — TROPONIN I

## 2013-10-23 LAB — PREGNANCY, URINE: Pregnancy Test, Urine: NEGATIVE m[IU]/mL

## 2013-10-24 ENCOUNTER — Telehealth: Payer: Self-pay | Admitting: Internal Medicine

## 2013-10-24 NOTE — Telephone Encounter (Signed)
Pt needs HFU for sepsis. Please advise where to add pt to the schedule./msn

## 2013-10-24 NOTE — Telephone Encounter (Signed)
Monday at 3:30 for 72min

## 2013-10-24 NOTE — Telephone Encounter (Signed)
Please call and schedule, thanks!

## 2013-10-25 DIAGNOSIS — G8220 Paraplegia, unspecified: Secondary | ICD-10-CM | POA: Diagnosis not present

## 2013-10-25 DIAGNOSIS — S91104D Unspecified open wound of right lesser toe(s) without damage to nail, subsequent encounter: Secondary | ICD-10-CM | POA: Diagnosis not present

## 2013-10-25 DIAGNOSIS — Z9181 History of falling: Secondary | ICD-10-CM | POA: Diagnosis not present

## 2013-10-25 DIAGNOSIS — N12 Tubulo-interstitial nephritis, not specified as acute or chronic: Secondary | ICD-10-CM | POA: Diagnosis not present

## 2013-10-25 DIAGNOSIS — G8929 Other chronic pain: Secondary | ICD-10-CM | POA: Diagnosis not present

## 2013-10-25 DIAGNOSIS — Z4789 Encounter for other orthopedic aftercare: Secondary | ICD-10-CM | POA: Diagnosis not present

## 2013-10-25 DIAGNOSIS — F419 Anxiety disorder, unspecified: Secondary | ICD-10-CM | POA: Diagnosis not present

## 2013-10-25 DIAGNOSIS — Z8571 Personal history of Hodgkin lymphoma: Secondary | ICD-10-CM | POA: Diagnosis not present

## 2013-10-25 DIAGNOSIS — F329 Major depressive disorder, single episode, unspecified: Secondary | ICD-10-CM | POA: Diagnosis not present

## 2013-10-25 DIAGNOSIS — S34109S Unspecified injury to unspecified level of lumbar spinal cord, sequela: Secondary | ICD-10-CM | POA: Diagnosis not present

## 2013-10-26 LAB — BETA STREP CULTURE(ARMC)

## 2013-10-27 DIAGNOSIS — S34109S Unspecified injury to unspecified level of lumbar spinal cord, sequela: Secondary | ICD-10-CM | POA: Diagnosis not present

## 2013-10-27 DIAGNOSIS — S91104D Unspecified open wound of right lesser toe(s) without damage to nail, subsequent encounter: Secondary | ICD-10-CM | POA: Diagnosis not present

## 2013-10-27 DIAGNOSIS — Z4789 Encounter for other orthopedic aftercare: Secondary | ICD-10-CM | POA: Diagnosis not present

## 2013-10-27 DIAGNOSIS — G8220 Paraplegia, unspecified: Secondary | ICD-10-CM | POA: Diagnosis not present

## 2013-10-27 DIAGNOSIS — N12 Tubulo-interstitial nephritis, not specified as acute or chronic: Secondary | ICD-10-CM | POA: Diagnosis not present

## 2013-10-27 DIAGNOSIS — G8929 Other chronic pain: Secondary | ICD-10-CM | POA: Diagnosis not present

## 2013-10-28 LAB — CULTURE, BLOOD (SINGLE)

## 2013-10-30 ENCOUNTER — Encounter: Payer: Self-pay | Admitting: Internal Medicine

## 2013-10-30 ENCOUNTER — Ambulatory Visit (INDEPENDENT_AMBULATORY_CARE_PROVIDER_SITE_OTHER): Payer: Medicare Other | Admitting: Internal Medicine

## 2013-10-30 VITALS — BP 118/70 | HR 95 | Temp 98.2°F | Ht 65.0 in

## 2013-10-30 DIAGNOSIS — G8220 Paraplegia, unspecified: Secondary | ICD-10-CM | POA: Diagnosis not present

## 2013-10-30 DIAGNOSIS — S34109S Unspecified injury to unspecified level of lumbar spinal cord, sequela: Secondary | ICD-10-CM | POA: Diagnosis not present

## 2013-10-30 DIAGNOSIS — N10 Acute tubulo-interstitial nephritis: Secondary | ICD-10-CM | POA: Insufficient documentation

## 2013-10-30 DIAGNOSIS — Q619 Cystic kidney disease, unspecified: Secondary | ICD-10-CM

## 2013-10-30 DIAGNOSIS — I998 Other disorder of circulatory system: Secondary | ICD-10-CM

## 2013-10-30 DIAGNOSIS — I878 Other specified disorders of veins: Secondary | ICD-10-CM | POA: Insufficient documentation

## 2013-10-30 DIAGNOSIS — Z720 Tobacco use: Secondary | ICD-10-CM

## 2013-10-30 DIAGNOSIS — F17200 Nicotine dependence, unspecified, uncomplicated: Secondary | ICD-10-CM

## 2013-10-30 DIAGNOSIS — N281 Cyst of kidney, acquired: Secondary | ICD-10-CM | POA: Insufficient documentation

## 2013-10-30 DIAGNOSIS — R109 Unspecified abdominal pain: Secondary | ICD-10-CM | POA: Diagnosis not present

## 2013-10-30 DIAGNOSIS — N12 Tubulo-interstitial nephritis, not specified as acute or chronic: Secondary | ICD-10-CM | POA: Diagnosis not present

## 2013-10-30 DIAGNOSIS — S91104D Unspecified open wound of right lesser toe(s) without damage to nail, subsequent encounter: Secondary | ICD-10-CM | POA: Diagnosis not present

## 2013-10-30 DIAGNOSIS — G8929 Other chronic pain: Secondary | ICD-10-CM | POA: Diagnosis not present

## 2013-10-30 DIAGNOSIS — Z4789 Encounter for other orthopedic aftercare: Secondary | ICD-10-CM | POA: Diagnosis not present

## 2013-10-30 DIAGNOSIS — R918 Other nonspecific abnormal finding of lung field: Secondary | ICD-10-CM | POA: Insufficient documentation

## 2013-10-30 LAB — POCT URINALYSIS DIPSTICK
Bilirubin, UA: NEGATIVE
Blood, UA: NEGATIVE
Glucose, UA: NEGATIVE
Ketones, UA: NEGATIVE
Leukocytes, UA: NEGATIVE
NITRITE UA: NEGATIVE
Protein, UA: NEGATIVE
SPEC GRAV UA: 1.02
UROBILINOGEN UA: 0.2
pH, UA: 5.5

## 2013-10-30 MED ORDER — NICOTINE 7 MG/24HR TD PT24: 7.0000 mg | MEDICATED_PATCH | Freq: Every day | TRANSDERMAL | Status: DC

## 2013-10-30 NOTE — Progress Notes (Signed)
Subjective:    Patient ID: Teresa [name], female    DOB: [birth year], 48 y.o.   MRN: [id number]  HPI 48YO female with h/o Hodgkin's Lymphoma presents for hospital follow up.  Last week, started feeling poorly at home with chills and bony pain, palpitations. Had pain in right flank. Went to [facility] ED Diagnosed with pyelonephritis. Admitted 8/24  Had CT chest imaging with pulmonary nodules noted in lungs. No abdominal CT performed. Given IV antibiotics, including Levaquin and Rocephin. Discharged home on Levaquin. Discharged 8/25  Started feeling poorly yesterday with more right flank pain. Temp at home 101-102F this weekend. Took Tylenol with some improvement. No dysuria, hematuria.  Tearful today describing frustration about care. Frustrated by difficulty with venous access. Has been talking with Home Health and wants to have a PICC line placed for lab draws.  Scheduled to see Dr. Gerald Dexter tomorrow.   Review of Systems  Constitutional: Positive for chills and fatigue. Negative for fever, appetite change and unexpected weight change.  Eyes: Negative for visual disturbance.  Respiratory: Negative for shortness of breath.   Cardiovascular: Positive for leg swelling (chronic). Negative for chest pain.  Gastrointestinal: Negative for nausea, vomiting, abdominal pain, diarrhea and constipation.  Genitourinary: Positive for flank pain. Negative for dysuria, urgency, frequency and pelvic pain.  Musculoskeletal: Positive for arthralgias, back pain, gait problem and myalgias.  Skin: Negative for color change and rash.  Hematological: Negative for adenopathy. Bruises/bleeds easily.  Psychiatric/Behavioral: Positive for sleep disturbance and dysphoric mood. Negative for suicidal ideas. The patient is nervous/anxious.        Objective:    BP 118/70  Pulse 95  Temp(Src) 98.2 F (36.8 C) (Oral)  Ht 5\' 5"  (1.651 m)  SpO2 98%  LMP 03/20/2001 Physical Exam  Constitutional: She is oriented to  person, place, and time. She appears well-developed and well-nourished. No distress.  HENT:  Head: Normocephalic and atraumatic.  Right Ear: External ear normal.  Left Ear: External ear normal.  Nose: Nose normal.  Mouth/Throat: Oropharynx is clear and moist. No oropharyngeal exudate.  Eyes: Conjunctivae are normal. Pupils are equal, round, and reactive to light. Right eye exhibits no discharge. Left eye exhibits no discharge. No scleral icterus.  Neck: Normal range of motion. Neck supple. No tracheal deviation present. No thyromegaly present.  Cardiovascular: Normal rate, regular rhythm, normal heart sounds and intact distal pulses.  Exam reveals no gallop and no friction rub.   No murmur heard. Pulmonary/Chest: Effort normal and breath sounds normal. No accessory muscle usage. Not tachypneic. No respiratory distress. She has no decreased breath sounds. She has no wheezes. She has no rhonchi. She has no rales. She exhibits no tenderness.  Abdominal: Normal appearance. There is tenderness (CVA). There is CVA tenderness (right).  Musculoskeletal: Normal range of motion. She exhibits no edema and no tenderness.  Lymphadenopathy:    She has no cervical adenopathy.  Neurological: She is alert and oriented to person, place, and time. No cranial nerve deficit. She exhibits normal muscle tone. Coordination normal.  Skin: Skin is warm and dry. No rash noted. Ecchymosis: purple ecchymoses over arms at sites of venipuncture. She is not diaphoretic. No erythema. No pallor.  Psychiatric: Her speech is normal and behavior is normal. Judgment and thought content normal. Her mood appears anxious. Cognition and memory are normal. She exhibits a depressed mood.          Assessment & Plan:   Problem List Items Addressed This Visit     Unprioritized  Acute pyelonephritis - Primary     S/p recent admission for pyelonephritis. UA today normal. Persistent right flank pain. Question if nephrolithiasis may  be causing pain. UA neg for blood. No CT abd performed on admission to [facility]. Will hold off imaging as pt [facility]. Question if CT abd/pel might be helpful to identify cause of right flank pain.    Relevant Orders      POCT Urinalysis Dipstick (Completed)   Poor venous access     Discussed pros and cons of PICC line with pt. Question if port placement might be helpful given need for repeated venipuncture for labs, imaging.    Pulmonary nodules     Several pulmonary nodules seen on recent CT Chest at [facility]. Difficult to tell by written report if these are changed from previous imaging at [facility]. We discussed that direct comparison of the images by radiology might be helpful. She will discussed with her oncologist at [facility].    Renal cyst     Reviewed previous CT abd from [facility] showing multiple renal cysts. Follow up with [facility] scheduled for tomorrow. Plan for follow up CT abd/pel to monitor cysts.    Right flank pain     Right flank pain persistent. As noted, pt has follow up at [facility]. Plan for repeat imaging of abdomen with CT or PET/CT.    Tobacco abuse     Encouraged smoking cessation. Rx for Nicotine replacement patch.    Relevant Medications      nicotine (NICODERM CQ) patch       Return in about 4 weeks (around 11/27/2013) for Recheck.

## 2013-10-30 NOTE — Assessment & Plan Note (Signed)
Several pulmonary nodules seen on recent CT Chest at [facility]. Difficult to tell by written report if these are changed from previous imaging at [facility]. We discussed that direct comparison of the images by radiology might be helpful. She will discussed with her oncologist at [facility].

## 2013-10-30 NOTE — Assessment & Plan Note (Signed)
S/p recent admission for pyelonephritis. UA today normal. Persistent right flank pain. Question if nephrolithiasis may be causing pain. UA neg for blood. No CT abd performed on admission to [facility]. Will hold off imaging as pt has follow up tomorrow at [facility]. Question if CT abd/pel might be helpful to identify cause of right flank pain.

## 2013-10-30 NOTE — Progress Notes (Signed)
Pre visit review using our clinic review tool, if applicable. No additional management support is needed unless otherwise documented below in the visit note. 

## 2013-10-30 NOTE — Assessment & Plan Note (Signed)
Discussed pros and cons of PICC line with pt. Question if port placement might be helpful given need for repeated venipuncture for labs, imaging.

## 2013-10-30 NOTE — Assessment & Plan Note (Signed)
Reviewed previous CT abd from [facility] showing multiple renal cysts. Follow up with [facility] scheduled for tomorrow. Plan for follow up CT abd/pel to monitor cysts.

## 2013-10-30 NOTE — Assessment & Plan Note (Signed)
Encouraged smoking cessation. Rx for Nicotine replacement patch.

## 2013-10-30 NOTE — Patient Instructions (Signed)
Repeat urinalysis today.  Follow up with Dr. Gerald Dexter tomorrow as scheduled.  Follow up here in 4 weeks.

## 2013-10-30 NOTE — Assessment & Plan Note (Signed)
Right flank pain persistent. As noted, pt has follow up at [facility]. Plan for repeat imaging of abdomen with CT or PET/CT.

## 2013-10-31 DIAGNOSIS — R61 Generalized hyperhidrosis: Secondary | ICD-10-CM | POA: Diagnosis not present

## 2013-10-31 DIAGNOSIS — R918 Other nonspecific abnormal finding of lung field: Secondary | ICD-10-CM | POA: Diagnosis not present

## 2013-10-31 DIAGNOSIS — Z8571 Personal history of Hodgkin lymphoma: Secondary | ICD-10-CM | POA: Diagnosis not present

## 2013-10-31 DIAGNOSIS — R509 Fever, unspecified: Secondary | ICD-10-CM | POA: Diagnosis not present

## 2013-10-31 DIAGNOSIS — N289 Disorder of kidney and ureter, unspecified: Secondary | ICD-10-CM | POA: Diagnosis not present

## 2013-10-31 DIAGNOSIS — R5383 Other fatigue: Secondary | ICD-10-CM | POA: Diagnosis not present

## 2013-10-31 DIAGNOSIS — R5381 Other malaise: Secondary | ICD-10-CM | POA: Diagnosis not present

## 2013-10-31 DIAGNOSIS — M549 Dorsalgia, unspecified: Secondary | ICD-10-CM | POA: Diagnosis not present

## 2013-11-02 DIAGNOSIS — M25579 Pain in unspecified ankle and joints of unspecified foot: Secondary | ICD-10-CM | POA: Diagnosis not present

## 2013-11-02 DIAGNOSIS — R52 Pain, unspecified: Secondary | ICD-10-CM | POA: Diagnosis not present

## 2013-11-03 DIAGNOSIS — Z4789 Encounter for other orthopedic aftercare: Secondary | ICD-10-CM | POA: Diagnosis not present

## 2013-11-03 DIAGNOSIS — S91104D Unspecified open wound of right lesser toe(s) without damage to nail, subsequent encounter: Secondary | ICD-10-CM | POA: Diagnosis not present

## 2013-11-03 DIAGNOSIS — S34109S Unspecified injury to unspecified level of lumbar spinal cord, sequela: Secondary | ICD-10-CM | POA: Diagnosis not present

## 2013-11-03 DIAGNOSIS — N12 Tubulo-interstitial nephritis, not specified as acute or chronic: Secondary | ICD-10-CM | POA: Diagnosis not present

## 2013-11-03 DIAGNOSIS — G8929 Other chronic pain: Secondary | ICD-10-CM | POA: Diagnosis not present

## 2013-11-03 DIAGNOSIS — G8220 Paraplegia, unspecified: Secondary | ICD-10-CM | POA: Diagnosis not present

## 2013-11-07 DIAGNOSIS — G8220 Paraplegia, unspecified: Secondary | ICD-10-CM | POA: Diagnosis not present

## 2013-11-07 DIAGNOSIS — S34109S Unspecified injury to unspecified level of lumbar spinal cord, sequela: Secondary | ICD-10-CM | POA: Diagnosis not present

## 2013-11-07 DIAGNOSIS — G8929 Other chronic pain: Secondary | ICD-10-CM | POA: Diagnosis not present

## 2013-11-07 DIAGNOSIS — Z4789 Encounter for other orthopedic aftercare: Secondary | ICD-10-CM | POA: Diagnosis not present

## 2013-11-07 DIAGNOSIS — N12 Tubulo-interstitial nephritis, not specified as acute or chronic: Secondary | ICD-10-CM | POA: Diagnosis not present

## 2013-11-07 DIAGNOSIS — S91104D Unspecified open wound of right lesser toe(s) without damage to nail, subsequent encounter: Secondary | ICD-10-CM | POA: Diagnosis not present

## 2013-11-09 ENCOUNTER — Telehealth: Payer: Self-pay | Admitting: *Deleted

## 2013-11-09 NOTE — Telephone Encounter (Signed)
Pt called states her surgical incision opened, saw Ortho they flushed and rewrapped wound.  Pt denies fever.  Pt wanted to make you aware.

## 2013-11-10 DIAGNOSIS — G8220 Paraplegia, unspecified: Secondary | ICD-10-CM | POA: Diagnosis not present

## 2013-11-10 DIAGNOSIS — G8929 Other chronic pain: Secondary | ICD-10-CM | POA: Diagnosis not present

## 2013-11-10 DIAGNOSIS — Z4789 Encounter for other orthopedic aftercare: Secondary | ICD-10-CM | POA: Diagnosis not present

## 2013-11-10 DIAGNOSIS — N12 Tubulo-interstitial nephritis, not specified as acute or chronic: Secondary | ICD-10-CM | POA: Diagnosis not present

## 2013-11-10 DIAGNOSIS — S91104D Unspecified open wound of right lesser toe(s) without damage to nail, subsequent encounter: Secondary | ICD-10-CM | POA: Diagnosis not present

## 2013-11-10 DIAGNOSIS — S34109S Unspecified injury to unspecified level of lumbar spinal cord, sequela: Secondary | ICD-10-CM | POA: Diagnosis not present

## 2013-11-12 DIAGNOSIS — M79609 Pain in unspecified limb: Secondary | ICD-10-CM | POA: Diagnosis not present

## 2013-11-12 DIAGNOSIS — Z5181 Encounter for therapeutic drug level monitoring: Secondary | ICD-10-CM | POA: Diagnosis not present

## 2013-11-12 DIAGNOSIS — M25579 Pain in unspecified ankle and joints of unspecified foot: Secondary | ICD-10-CM | POA: Diagnosis not present

## 2013-11-12 DIAGNOSIS — T8131XA Disruption of external operation (surgical) wound, not elsewhere classified, initial encounter: Secondary | ICD-10-CM | POA: Diagnosis not present

## 2013-11-12 DIAGNOSIS — R059 Cough, unspecified: Secondary | ICD-10-CM | POA: Diagnosis not present

## 2013-11-12 DIAGNOSIS — Z7982 Long term (current) use of aspirin: Secondary | ICD-10-CM | POA: Diagnosis not present

## 2013-11-12 DIAGNOSIS — R05 Cough: Secondary | ICD-10-CM | POA: Diagnosis not present

## 2013-11-12 DIAGNOSIS — R918 Other nonspecific abnormal finding of lung field: Secondary | ICD-10-CM | POA: Diagnosis not present

## 2013-11-12 DIAGNOSIS — Z9089 Acquired absence of other organs: Secondary | ICD-10-CM | POA: Diagnosis not present

## 2013-11-12 DIAGNOSIS — R509 Fever, unspecified: Secondary | ICD-10-CM | POA: Diagnosis not present

## 2013-11-12 DIAGNOSIS — Z79899 Other long term (current) drug therapy: Secondary | ICD-10-CM | POA: Diagnosis not present

## 2013-11-12 DIAGNOSIS — F17200 Nicotine dependence, unspecified, uncomplicated: Secondary | ICD-10-CM | POA: Diagnosis not present

## 2013-11-12 DIAGNOSIS — J029 Acute pharyngitis, unspecified: Secondary | ICD-10-CM | POA: Diagnosis not present

## 2013-11-12 DIAGNOSIS — C8190 Hodgkin lymphoma, unspecified, unspecified site: Secondary | ICD-10-CM | POA: Diagnosis not present

## 2013-11-13 DIAGNOSIS — R509 Fever, unspecified: Secondary | ICD-10-CM | POA: Diagnosis not present

## 2013-11-13 DIAGNOSIS — R059 Cough, unspecified: Secondary | ICD-10-CM | POA: Diagnosis not present

## 2013-11-13 DIAGNOSIS — M79609 Pain in unspecified limb: Secondary | ICD-10-CM | POA: Diagnosis not present

## 2013-11-13 DIAGNOSIS — M25579 Pain in unspecified ankle and joints of unspecified foot: Secondary | ICD-10-CM | POA: Diagnosis not present

## 2013-11-13 DIAGNOSIS — R918 Other nonspecific abnormal finding of lung field: Secondary | ICD-10-CM | POA: Diagnosis not present

## 2013-11-13 DIAGNOSIS — R05 Cough: Secondary | ICD-10-CM | POA: Diagnosis not present

## 2013-11-15 DIAGNOSIS — G8220 Paraplegia, unspecified: Secondary | ICD-10-CM | POA: Diagnosis not present

## 2013-11-15 DIAGNOSIS — N12 Tubulo-interstitial nephritis, not specified as acute or chronic: Secondary | ICD-10-CM | POA: Diagnosis not present

## 2013-11-15 DIAGNOSIS — S91104D Unspecified open wound of right lesser toe(s) without damage to nail, subsequent encounter: Secondary | ICD-10-CM | POA: Diagnosis not present

## 2013-11-15 DIAGNOSIS — Z4789 Encounter for other orthopedic aftercare: Secondary | ICD-10-CM | POA: Diagnosis not present

## 2013-11-15 DIAGNOSIS — G8929 Other chronic pain: Secondary | ICD-10-CM | POA: Diagnosis not present

## 2013-11-15 DIAGNOSIS — S34109S Unspecified injury to unspecified level of lumbar spinal cord, sequela: Secondary | ICD-10-CM | POA: Diagnosis not present

## 2013-11-17 DIAGNOSIS — Z4789 Encounter for other orthopedic aftercare: Secondary | ICD-10-CM | POA: Diagnosis not present

## 2013-11-17 DIAGNOSIS — S91104D Unspecified open wound of right lesser toe(s) without damage to nail, subsequent encounter: Secondary | ICD-10-CM | POA: Diagnosis not present

## 2013-11-17 DIAGNOSIS — G8929 Other chronic pain: Secondary | ICD-10-CM | POA: Diagnosis not present

## 2013-11-17 DIAGNOSIS — N12 Tubulo-interstitial nephritis, not specified as acute or chronic: Secondary | ICD-10-CM | POA: Diagnosis not present

## 2013-11-17 DIAGNOSIS — S34109S Unspecified injury to unspecified level of lumbar spinal cord, sequela: Secondary | ICD-10-CM | POA: Diagnosis not present

## 2013-11-17 DIAGNOSIS — G8220 Paraplegia, unspecified: Secondary | ICD-10-CM | POA: Diagnosis not present

## 2013-11-20 DIAGNOSIS — N12 Tubulo-interstitial nephritis, not specified as acute or chronic: Secondary | ICD-10-CM | POA: Diagnosis not present

## 2013-11-20 DIAGNOSIS — Z4789 Encounter for other orthopedic aftercare: Secondary | ICD-10-CM | POA: Diagnosis not present

## 2013-11-20 DIAGNOSIS — S34109S Unspecified injury to unspecified level of lumbar spinal cord, sequela: Secondary | ICD-10-CM | POA: Diagnosis not present

## 2013-11-20 DIAGNOSIS — G8220 Paraplegia, unspecified: Secondary | ICD-10-CM | POA: Diagnosis not present

## 2013-11-20 DIAGNOSIS — G8929 Other chronic pain: Secondary | ICD-10-CM | POA: Diagnosis not present

## 2013-11-20 DIAGNOSIS — S91104D Unspecified open wound of right lesser toe(s) without damage to nail, subsequent encounter: Secondary | ICD-10-CM | POA: Diagnosis not present

## 2013-11-21 DIAGNOSIS — S91104D Unspecified open wound of right lesser toe(s) without damage to nail, subsequent encounter: Secondary | ICD-10-CM | POA: Diagnosis not present

## 2013-11-21 DIAGNOSIS — Z4789 Encounter for other orthopedic aftercare: Secondary | ICD-10-CM | POA: Diagnosis not present

## 2013-11-21 DIAGNOSIS — G8220 Paraplegia, unspecified: Secondary | ICD-10-CM | POA: Diagnosis not present

## 2013-11-21 DIAGNOSIS — S34109S Unspecified injury to unspecified level of lumbar spinal cord, sequela: Secondary | ICD-10-CM | POA: Diagnosis not present

## 2013-11-21 DIAGNOSIS — N12 Tubulo-interstitial nephritis, not specified as acute or chronic: Secondary | ICD-10-CM | POA: Diagnosis not present

## 2013-11-21 DIAGNOSIS — G8929 Other chronic pain: Secondary | ICD-10-CM | POA: Diagnosis not present

## 2013-11-24 DIAGNOSIS — Z4789 Encounter for other orthopedic aftercare: Secondary | ICD-10-CM | POA: Diagnosis not present

## 2013-11-24 DIAGNOSIS — S34109S Unspecified injury to unspecified level of lumbar spinal cord, sequela: Secondary | ICD-10-CM | POA: Diagnosis not present

## 2013-11-24 DIAGNOSIS — G8929 Other chronic pain: Secondary | ICD-10-CM | POA: Diagnosis not present

## 2013-11-24 DIAGNOSIS — S91104D Unspecified open wound of right lesser toe(s) without damage to nail, subsequent encounter: Secondary | ICD-10-CM | POA: Diagnosis not present

## 2013-11-24 DIAGNOSIS — N12 Tubulo-interstitial nephritis, not specified as acute or chronic: Secondary | ICD-10-CM | POA: Diagnosis not present

## 2013-11-24 DIAGNOSIS — G8220 Paraplegia, unspecified: Secondary | ICD-10-CM | POA: Diagnosis not present

## 2013-11-28 ENCOUNTER — Telehealth: Payer: Self-pay | Admitting: *Deleted

## 2013-11-28 NOTE — Telephone Encounter (Signed)
Home health called to advise that a home visit that was scheduled on Friday was missed due to pt having a doctors appt

## 2013-11-30 ENCOUNTER — Encounter: Payer: Self-pay | Admitting: Internal Medicine

## 2013-11-30 ENCOUNTER — Ambulatory Visit (INDEPENDENT_AMBULATORY_CARE_PROVIDER_SITE_OTHER): Payer: Medicare Other | Admitting: Internal Medicine

## 2013-11-30 VITALS — BP 102/60 | HR 100 | Temp 98.5°F | Ht 65.0 in | Wt 209.8 lb

## 2013-11-30 DIAGNOSIS — F329 Major depressive disorder, single episode, unspecified: Secondary | ICD-10-CM

## 2013-11-30 DIAGNOSIS — Z139 Encounter for screening, unspecified: Secondary | ICD-10-CM | POA: Diagnosis not present

## 2013-11-30 DIAGNOSIS — M25571 Pain in right ankle and joints of right foot: Secondary | ICD-10-CM

## 2013-11-30 DIAGNOSIS — R509 Fever, unspecified: Secondary | ICD-10-CM

## 2013-11-30 DIAGNOSIS — M25579 Pain in unspecified ankle and joints of unspecified foot: Secondary | ICD-10-CM | POA: Insufficient documentation

## 2013-11-30 DIAGNOSIS — Z8571 Personal history of Hodgkin lymphoma: Secondary | ICD-10-CM | POA: Diagnosis not present

## 2013-11-30 DIAGNOSIS — F32A Depression, unspecified: Secondary | ICD-10-CM

## 2013-11-30 LAB — POCT URINALYSIS DIPSTICK
Bilirubin, UA: NEGATIVE
Blood, UA: NEGATIVE
Glucose, UA: NEGATIVE
KETONES UA: NEGATIVE
LEUKOCYTES UA: NEGATIVE
Nitrite, UA: NEGATIVE
PROTEIN UA: NEGATIVE
Spec Grav, UA: 1.02
UROBILINOGEN UA: 0.2
pH, UA: 5.5

## 2013-11-30 MED ORDER — ARIPIPRAZOLE 2 MG PO TABS: 2.0000 mg | ORAL_TABLET | Freq: Every day | ORAL | Status: DC

## 2013-11-30 NOTE — Progress Notes (Signed)
Subjective:    Patient ID: Teresa [name], female    DOB: December 22, 1965, 48 y.o.   MRN: [id number]  HPI 48YO female with history of NHL presents for follow up.  `Recently seen by Dr. Gerald Dexter. No evidence of recurrent malignancy.  Seen at [facility] for open incision right ankle. Started on Keflex. Then developed fever 101F, 102F. Nausea, vomiting, leg swelling. Seen in [facility] ED. WBC was elevated. Cultures were not sent. Xrays showed swelling but no evidence of osteomyelitis. [place] was negative for DVT. Ended up finishing Keflex. Home health nurse followed up on wound. Followed temperature. Cleaning wound with hydrogen peroxide. Wound seems to be improving. Continues to have some fever 102-103F. Yolanda Bonine has also been sick with fever and cough. Follow up with Jefm Bryant scheduled for Oct 9th.   Review of Systems  Constitutional: Positive for fever and chills. Negative for appetite change, fatigue and unexpected weight change.  Eyes: Negative for visual disturbance.  Respiratory: Negative for shortness of breath.   Cardiovascular: Negative for chest pain and leg swelling.  Gastrointestinal: Negative for nausea, vomiting, abdominal pain, diarrhea and constipation.  Musculoskeletal: Positive for arthralgias, joint swelling and myalgias.  Skin: Positive for color change and wound. Negative for rash.  Hematological: Negative for adenopathy. Does not bruise/bleed easily.  Psychiatric/Behavioral: Positive for dysphoric mood. Negative for sleep disturbance. The patient is not nervous/anxious.        Objective:    BP 102/60  Pulse 100  Temp(Src) 98.5 F (36.9 C) (Oral)  Ht 5\' 5"  (1.651 m)  Wt 209 lb 12 oz (95.142 kg)  BMI 34.90 kg/m2  SpO2 96%  LMP 03/20/2001 Physical Exam  Constitutional: She is oriented to person, place, and time. She appears well-developed and well-nourished. No distress.  HENT:  Head: Normocephalic and atraumatic.  Right Ear: External ear normal.  Left Ear: External  ear normal.  Nose: Nose normal.  Mouth/Throat: Oropharynx is clear and moist.  Eyes: Conjunctivae are normal. Pupils are equal, round, and reactive to light. Right eye exhibits no discharge. Left eye exhibits no discharge. No scleral icterus.  Neck: Normal range of motion. Neck supple. No tracheal deviation present. No thyromegaly present.  Cardiovascular: Normal rate, regular rhythm, normal heart sounds and intact distal pulses.  Exam reveals no gallop and no friction rub.   No murmur heard. Pulmonary/Chest: Effort normal and breath sounds normal. No accessory muscle usage. Not tachypneic. No respiratory distress. She has no decreased breath sounds. She has no wheezes. She has no rhonchi. She has no rales. She exhibits no tenderness.  Musculoskeletal: Normal range of motion. She exhibits no edema and no tenderness.       Right ankle: She exhibits swelling.       Feet:  Lymphadenopathy:    She has no cervical adenopathy.  Neurological: She is alert and oriented to person, place, and time. No cranial nerve deficit. She exhibits normal muscle tone. Coordination normal.  Skin: Skin is warm and dry. No rash noted. She is not diaphoretic. No erythema. No pallor.  Psychiatric: She has a normal mood and affect. Her behavior is normal. Judgment and thought content normal. Cognition and memory are normal.          Assessment & Plan:   Problem List Items Addressed This Visit     Unprioritized   Depression     Recent slight worsening of symptoms. Will restart Abilify which helped her in the past during similar event. Continue Cymbalta. Follow up 1-3 months and  prn.    Fever and chills     Recent reported fever, however afebrile today. Recent labs from [facility] and xray reviewed today. Suspect recent viral infection. Will continue to monitor. She will call or RTC if any recurrent fever.    History of hodgkin's lymphoma     Reviewed recent notes from Dr. Gerald Dexter. No evidence of recurrent NHL.     Pain in joint, ankle and foot - Primary     Pain in right ankle at site of previous surgery and open wound. Wound appears to be healing with no evidence of infection today. Encouraged her to follow up as scheduled with ortho next week.     Other Visit Diagnoses   Screening        Relevant Orders       POCT urinalysis dipstick (Completed)        Return in about 3 months (around 03/02/2014) for Recheck.

## 2013-11-30 NOTE — Assessment & Plan Note (Signed)
Reviewed recent notes from Dr. Gerald Dexter. No evidence of recurrent NHL.

## 2013-11-30 NOTE — Progress Notes (Signed)
Pre visit review using our clinic review tool, if applicable. No additional management support is needed unless otherwise documented below in the visit note. 

## 2013-11-30 NOTE — Assessment & Plan Note (Signed)
Recent slight worsening of symptoms. Will restart Abilify which helped her in the past during similar event. Continue Cymbalta. Follow up 1-3 months and prn.

## 2013-11-30 NOTE — Assessment & Plan Note (Signed)
Pain in right ankle at site of previous surgery and open wound. Wound appears to be healing with no evidence of infection today. Encouraged her to follow up as scheduled with ortho next week.

## 2013-11-30 NOTE — Patient Instructions (Signed)
Follow up with ortho as scheduled.  Follow up here in 3 months or sooner as needed.

## 2013-11-30 NOTE — Assessment & Plan Note (Signed)
Recent reported fever, however afebrile today. Recent labs from [place] and xray reviewed today. Suspect recent viral infection. Will continue to monitor. She will call or RTC if any recurrent fever.

## 2013-12-12 DIAGNOSIS — Z4789 Encounter for other orthopedic aftercare: Secondary | ICD-10-CM | POA: Diagnosis not present

## 2013-12-12 DIAGNOSIS — S34109S Unspecified injury to unspecified level of lumbar spinal cord, sequela: Secondary | ICD-10-CM | POA: Diagnosis not present

## 2013-12-12 DIAGNOSIS — G8220 Paraplegia, unspecified: Secondary | ICD-10-CM | POA: Diagnosis not present

## 2013-12-12 DIAGNOSIS — S91104D Unspecified open wound of right lesser toe(s) without damage to nail, subsequent encounter: Secondary | ICD-10-CM | POA: Diagnosis not present

## 2013-12-12 DIAGNOSIS — G8929 Other chronic pain: Secondary | ICD-10-CM | POA: Diagnosis not present

## 2013-12-12 DIAGNOSIS — N12 Tubulo-interstitial nephritis, not specified as acute or chronic: Secondary | ICD-10-CM | POA: Diagnosis not present

## 2013-12-18 DIAGNOSIS — G8929 Other chronic pain: Secondary | ICD-10-CM | POA: Diagnosis not present

## 2013-12-18 DIAGNOSIS — G8220 Paraplegia, unspecified: Secondary | ICD-10-CM | POA: Diagnosis not present

## 2013-12-18 DIAGNOSIS — N12 Tubulo-interstitial nephritis, not specified as acute or chronic: Secondary | ICD-10-CM | POA: Diagnosis not present

## 2013-12-18 DIAGNOSIS — S34109S Unspecified injury to unspecified level of lumbar spinal cord, sequela: Secondary | ICD-10-CM | POA: Diagnosis not present

## 2013-12-18 DIAGNOSIS — S91104D Unspecified open wound of right lesser toe(s) without damage to nail, subsequent encounter: Secondary | ICD-10-CM | POA: Diagnosis not present

## 2013-12-18 DIAGNOSIS — Z4789 Encounter for other orthopedic aftercare: Secondary | ICD-10-CM | POA: Diagnosis not present

## 2013-12-20 DIAGNOSIS — G8220 Paraplegia, unspecified: Secondary | ICD-10-CM | POA: Diagnosis not present

## 2013-12-20 DIAGNOSIS — N12 Tubulo-interstitial nephritis, not specified as acute or chronic: Secondary | ICD-10-CM | POA: Diagnosis not present

## 2013-12-20 DIAGNOSIS — Z4789 Encounter for other orthopedic aftercare: Secondary | ICD-10-CM | POA: Diagnosis not present

## 2013-12-20 DIAGNOSIS — G8929 Other chronic pain: Secondary | ICD-10-CM | POA: Diagnosis not present

## 2013-12-20 DIAGNOSIS — S91104D Unspecified open wound of right lesser toe(s) without damage to nail, subsequent encounter: Secondary | ICD-10-CM | POA: Diagnosis not present

## 2013-12-20 DIAGNOSIS — S34109S Unspecified injury to unspecified level of lumbar spinal cord, sequela: Secondary | ICD-10-CM | POA: Diagnosis not present

## 2013-12-22 DIAGNOSIS — S91104D Unspecified open wound of right lesser toe(s) without damage to nail, subsequent encounter: Secondary | ICD-10-CM | POA: Diagnosis not present

## 2013-12-22 DIAGNOSIS — S34109S Unspecified injury to unspecified level of lumbar spinal cord, sequela: Secondary | ICD-10-CM | POA: Diagnosis not present

## 2013-12-22 DIAGNOSIS — N12 Tubulo-interstitial nephritis, not specified as acute or chronic: Secondary | ICD-10-CM | POA: Diagnosis not present

## 2013-12-22 DIAGNOSIS — Z4789 Encounter for other orthopedic aftercare: Secondary | ICD-10-CM | POA: Diagnosis not present

## 2013-12-22 DIAGNOSIS — G8929 Other chronic pain: Secondary | ICD-10-CM | POA: Diagnosis not present

## 2013-12-22 DIAGNOSIS — G8220 Paraplegia, unspecified: Secondary | ICD-10-CM | POA: Diagnosis not present

## 2013-12-24 DIAGNOSIS — F329 Major depressive disorder, single episode, unspecified: Secondary | ICD-10-CM | POA: Diagnosis not present

## 2013-12-24 DIAGNOSIS — G8929 Other chronic pain: Secondary | ICD-10-CM | POA: Diagnosis not present

## 2013-12-24 DIAGNOSIS — S34109S Unspecified injury to unspecified level of lumbar spinal cord, sequela: Secondary | ICD-10-CM | POA: Diagnosis not present

## 2013-12-24 DIAGNOSIS — Z8571 Personal history of Hodgkin lymphoma: Secondary | ICD-10-CM | POA: Diagnosis not present

## 2013-12-24 DIAGNOSIS — G8220 Paraplegia, unspecified: Secondary | ICD-10-CM | POA: Diagnosis not present

## 2013-12-24 DIAGNOSIS — Z9181 History of falling: Secondary | ICD-10-CM | POA: Diagnosis not present

## 2013-12-24 DIAGNOSIS — Z4789 Encounter for other orthopedic aftercare: Secondary | ICD-10-CM | POA: Diagnosis not present

## 2013-12-24 DIAGNOSIS — F419 Anxiety disorder, unspecified: Secondary | ICD-10-CM | POA: Diagnosis not present

## 2013-12-27 DIAGNOSIS — Z4789 Encounter for other orthopedic aftercare: Secondary | ICD-10-CM | POA: Diagnosis not present

## 2013-12-27 DIAGNOSIS — G8220 Paraplegia, unspecified: Secondary | ICD-10-CM | POA: Diagnosis not present

## 2013-12-27 DIAGNOSIS — G8929 Other chronic pain: Secondary | ICD-10-CM | POA: Diagnosis not present

## 2013-12-27 DIAGNOSIS — F419 Anxiety disorder, unspecified: Secondary | ICD-10-CM | POA: Diagnosis not present

## 2013-12-27 DIAGNOSIS — S34109S Unspecified injury to unspecified level of lumbar spinal cord, sequela: Secondary | ICD-10-CM | POA: Diagnosis not present

## 2013-12-27 DIAGNOSIS — F329 Major depressive disorder, single episode, unspecified: Secondary | ICD-10-CM | POA: Diagnosis not present

## 2013-12-28 ENCOUNTER — Emergency Department: Payer: Self-pay | Admitting: Emergency Medicine

## 2013-12-28 DIAGNOSIS — Z880 Allergy status to penicillin: Secondary | ICD-10-CM | POA: Diagnosis not present

## 2013-12-28 DIAGNOSIS — R197 Diarrhea, unspecified: Secondary | ICD-10-CM | POA: Diagnosis not present

## 2013-12-28 DIAGNOSIS — R1032 Left lower quadrant pain: Secondary | ICD-10-CM | POA: Diagnosis not present

## 2013-12-28 DIAGNOSIS — K625 Hemorrhage of anus and rectum: Secondary | ICD-10-CM | POA: Diagnosis not present

## 2013-12-28 DIAGNOSIS — R609 Edema, unspecified: Secondary | ICD-10-CM | POA: Diagnosis not present

## 2013-12-28 DIAGNOSIS — Z91040 Latex allergy status: Secondary | ICD-10-CM | POA: Diagnosis not present

## 2013-12-28 DIAGNOSIS — Z720 Tobacco use: Secondary | ICD-10-CM | POA: Diagnosis not present

## 2013-12-28 DIAGNOSIS — R110 Nausea: Secondary | ICD-10-CM | POA: Diagnosis not present

## 2013-12-28 LAB — CBC
HCT: 39 % (ref 35.0–47.0)
HGB: 12.9 g/dL (ref 12.0–16.0)
MCH: 31.7 pg (ref 26.0–34.0)
MCHC: 32.9 g/dL (ref 32.0–36.0)
MCV: 96 fL (ref 80–100)
PLATELETS: 371 10*3/uL (ref 150–440)
RBC: 4.06 10*6/uL (ref 3.80–5.20)
RDW: 13.3 % (ref 11.5–14.5)
WBC: 11 10*3/uL (ref 3.6–11.0)

## 2013-12-28 LAB — COMPREHENSIVE METABOLIC PANEL
ALBUMIN: 3.6 g/dL (ref 3.4–5.0)
ALT: 23 U/L
ANION GAP: 8 (ref 7–16)
AST: 22 U/L (ref 15–37)
Alkaline Phosphatase: 79 U/L
BILIRUBIN TOTAL: 0.2 mg/dL (ref 0.2–1.0)
BUN: 11 mg/dL (ref 7–18)
CHLORIDE: 106 mmol/L (ref 98–107)
CO2: 27 mmol/L (ref 21–32)
Calcium, Total: 8.5 mg/dL (ref 8.5–10.1)
Creatinine: 0.72 mg/dL (ref 0.60–1.30)
EGFR (African American): >60
GLUCOSE: 90 mg/dL (ref 65–99)
Osmolality: 280 (ref 275–301)
Potassium: 3.6 mmol/L (ref 3.5–5.1)
SODIUM: 141 mmol/L (ref 136–145)
TOTAL PROTEIN: 7.5 g/dL (ref 6.4–8.2)

## 2013-12-28 LAB — URINALYSIS, COMPLETE
BILIRUBIN, UR: NEGATIVE
Bacteria: NONE SEEN
Blood: NEGATIVE
Glucose,UR: NEGATIVE mg/dL (ref 0–75)
LEUKOCYTE ESTERASE: NEGATIVE
NITRITE: NEGATIVE
PH: 5 (ref 4.5–8.0)
Protein: NEGATIVE
RBC,UR: 3 /HPF (ref 0–5)
SPECIFIC GRAVITY: 1.026 (ref 1.003–1.030)
Squamous Epithelial: 9
WBC UR: 2 /HPF (ref 0–5)

## 2013-12-28 LAB — PROTIME-INR
INR: 1
PROTHROMBIN TIME: 12.9 s (ref 11.5–14.7)

## 2013-12-29 LAB — PREGNANCY, URINE: PREGNANCY TEST, URINE: NEGATIVE m[IU]/mL

## 2014-01-02 DIAGNOSIS — G8929 Other chronic pain: Secondary | ICD-10-CM | POA: Diagnosis not present

## 2014-01-02 DIAGNOSIS — G8220 Paraplegia, unspecified: Secondary | ICD-10-CM | POA: Diagnosis not present

## 2014-01-02 DIAGNOSIS — F419 Anxiety disorder, unspecified: Secondary | ICD-10-CM | POA: Diagnosis not present

## 2014-01-02 DIAGNOSIS — Z4789 Encounter for other orthopedic aftercare: Secondary | ICD-10-CM | POA: Diagnosis not present

## 2014-01-02 DIAGNOSIS — S34109S Unspecified injury to unspecified level of lumbar spinal cord, sequela: Secondary | ICD-10-CM | POA: Diagnosis not present

## 2014-01-02 DIAGNOSIS — F329 Major depressive disorder, single episode, unspecified: Secondary | ICD-10-CM | POA: Diagnosis not present

## 2014-01-04 ENCOUNTER — Encounter: Payer: Self-pay | Admitting: Internal Medicine

## 2014-01-12 DIAGNOSIS — S93411A Sprain of calcaneofibular ligament of right ankle, initial encounter: Secondary | ICD-10-CM | POA: Diagnosis not present

## 2014-01-12 DIAGNOSIS — Z9889 Other specified postprocedural states: Secondary | ICD-10-CM | POA: Diagnosis not present

## 2014-01-22 DIAGNOSIS — F419 Anxiety disorder, unspecified: Secondary | ICD-10-CM | POA: Diagnosis not present

## 2014-01-22 DIAGNOSIS — F329 Major depressive disorder, single episode, unspecified: Secondary | ICD-10-CM | POA: Diagnosis not present

## 2014-01-22 DIAGNOSIS — G8929 Other chronic pain: Secondary | ICD-10-CM | POA: Diagnosis not present

## 2014-01-22 DIAGNOSIS — Z4789 Encounter for other orthopedic aftercare: Secondary | ICD-10-CM | POA: Diagnosis not present

## 2014-01-22 DIAGNOSIS — G8220 Paraplegia, unspecified: Secondary | ICD-10-CM | POA: Diagnosis not present

## 2014-01-22 DIAGNOSIS — S34109S Unspecified injury to unspecified level of lumbar spinal cord, sequela: Secondary | ICD-10-CM | POA: Diagnosis not present

## 2014-01-30 DIAGNOSIS — Z4789 Encounter for other orthopedic aftercare: Secondary | ICD-10-CM | POA: Diagnosis not present

## 2014-01-30 DIAGNOSIS — S34109S Unspecified injury to unspecified level of lumbar spinal cord, sequela: Secondary | ICD-10-CM | POA: Diagnosis not present

## 2014-01-30 DIAGNOSIS — F419 Anxiety disorder, unspecified: Secondary | ICD-10-CM | POA: Diagnosis not present

## 2014-01-30 DIAGNOSIS — F329 Major depressive disorder, single episode, unspecified: Secondary | ICD-10-CM | POA: Diagnosis not present

## 2014-01-30 DIAGNOSIS — G8929 Other chronic pain: Secondary | ICD-10-CM | POA: Diagnosis not present

## 2014-01-30 DIAGNOSIS — G8220 Paraplegia, unspecified: Secondary | ICD-10-CM | POA: Diagnosis not present

## 2014-02-01 ENCOUNTER — Ambulatory Visit: Payer: Medicare Other | Admitting: Nurse Practitioner

## 2014-02-01 DIAGNOSIS — S34109S Unspecified injury to unspecified level of lumbar spinal cord, sequela: Secondary | ICD-10-CM | POA: Diagnosis not present

## 2014-02-01 DIAGNOSIS — Z4789 Encounter for other orthopedic aftercare: Secondary | ICD-10-CM | POA: Diagnosis not present

## 2014-02-01 DIAGNOSIS — G8929 Other chronic pain: Secondary | ICD-10-CM | POA: Diagnosis not present

## 2014-02-01 DIAGNOSIS — F329 Major depressive disorder, single episode, unspecified: Secondary | ICD-10-CM | POA: Diagnosis not present

## 2014-02-01 DIAGNOSIS — G8220 Paraplegia, unspecified: Secondary | ICD-10-CM | POA: Diagnosis not present

## 2014-02-01 DIAGNOSIS — F419 Anxiety disorder, unspecified: Secondary | ICD-10-CM | POA: Diagnosis not present

## 2014-02-05 ENCOUNTER — Other Ambulatory Visit: Payer: Self-pay | Admitting: *Deleted

## 2014-02-05 DIAGNOSIS — Z139 Encounter for screening, unspecified: Secondary | ICD-10-CM

## 2014-02-05 LAB — POCT URINALYSIS DIPSTICK
Bilirubin, UA: NEGATIVE
Blood, UA: NEGATIVE
Glucose, UA: NEGATIVE
Ketones, UA: NEGATIVE
Leukocytes, UA: NEGATIVE
NITRITE UA: NEGATIVE
PH UA: 5
PROTEIN UA: NEGATIVE
Spec Grav, UA: 1.015
UROBILINOGEN UA: 0.2

## 2014-02-06 DIAGNOSIS — F329 Major depressive disorder, single episode, unspecified: Secondary | ICD-10-CM | POA: Diagnosis not present

## 2014-02-06 DIAGNOSIS — G8929 Other chronic pain: Secondary | ICD-10-CM | POA: Diagnosis not present

## 2014-02-06 DIAGNOSIS — G8220 Paraplegia, unspecified: Secondary | ICD-10-CM | POA: Diagnosis not present

## 2014-02-06 DIAGNOSIS — S34109S Unspecified injury to unspecified level of lumbar spinal cord, sequela: Secondary | ICD-10-CM | POA: Diagnosis not present

## 2014-02-06 DIAGNOSIS — F419 Anxiety disorder, unspecified: Secondary | ICD-10-CM | POA: Diagnosis not present

## 2014-02-06 DIAGNOSIS — Z4789 Encounter for other orthopedic aftercare: Secondary | ICD-10-CM | POA: Diagnosis not present

## 2014-02-06 LAB — URINE CULTURE: Colony Count: 15000

## 2014-02-12 ENCOUNTER — Ambulatory Visit: Payer: Medicare Other | Admitting: Nurse Practitioner

## 2014-02-13 DIAGNOSIS — S34109S Unspecified injury to unspecified level of lumbar spinal cord, sequela: Secondary | ICD-10-CM | POA: Diagnosis not present

## 2014-02-13 DIAGNOSIS — G8929 Other chronic pain: Secondary | ICD-10-CM | POA: Diagnosis not present

## 2014-02-13 DIAGNOSIS — F419 Anxiety disorder, unspecified: Secondary | ICD-10-CM | POA: Diagnosis not present

## 2014-02-13 DIAGNOSIS — Z4789 Encounter for other orthopedic aftercare: Secondary | ICD-10-CM | POA: Diagnosis not present

## 2014-02-13 DIAGNOSIS — F329 Major depressive disorder, single episode, unspecified: Secondary | ICD-10-CM | POA: Diagnosis not present

## 2014-02-13 DIAGNOSIS — G8220 Paraplegia, unspecified: Secondary | ICD-10-CM | POA: Diagnosis not present

## 2014-02-14 DIAGNOSIS — G8220 Paraplegia, unspecified: Secondary | ICD-10-CM | POA: Diagnosis not present

## 2014-02-14 DIAGNOSIS — F419 Anxiety disorder, unspecified: Secondary | ICD-10-CM | POA: Diagnosis not present

## 2014-02-14 DIAGNOSIS — F329 Major depressive disorder, single episode, unspecified: Secondary | ICD-10-CM | POA: Diagnosis not present

## 2014-02-14 DIAGNOSIS — Z4789 Encounter for other orthopedic aftercare: Secondary | ICD-10-CM | POA: Diagnosis not present

## 2014-02-14 DIAGNOSIS — G8929 Other chronic pain: Secondary | ICD-10-CM | POA: Diagnosis not present

## 2014-02-14 DIAGNOSIS — S34109S Unspecified injury to unspecified level of lumbar spinal cord, sequela: Secondary | ICD-10-CM | POA: Diagnosis not present

## 2014-02-28 DIAGNOSIS — M818 Other osteoporosis without current pathological fracture: Secondary | ICD-10-CM | POA: Diagnosis not present

## 2014-02-28 DIAGNOSIS — M25562 Pain in left knee: Secondary | ICD-10-CM | POA: Diagnosis not present

## 2014-03-05 ENCOUNTER — Other Ambulatory Visit: Payer: Self-pay

## 2014-03-05 MED ORDER — ALBUTEROL SULFATE HFA 108 (90 BASE) MCG/ACT IN AERS: 2.0000 | INHALATION_SPRAY | Freq: Four times a day (QID) | RESPIRATORY_TRACT | Status: DC | PRN

## 2014-03-05 MED ORDER — LEVOTHYROXINE SODIUM 125 MCG PO TABS: 125.0000 ug | ORAL_TABLET | Freq: Every day | ORAL | Status: DC

## 2014-03-05 MED ORDER — PANTOPRAZOLE SODIUM 40 MG PO TBEC: DELAYED_RELEASE_TABLET | ORAL | Status: DC

## 2014-03-05 MED ORDER — DULOXETINE HCL 60 MG PO CPEP: 60.0000 mg | ORAL_CAPSULE | Freq: Every day | ORAL | Status: DC

## 2014-03-12 ENCOUNTER — Ambulatory Visit: Payer: Medicare Other | Admitting: Internal Medicine

## 2014-03-21 DIAGNOSIS — M8580 Other specified disorders of bone density and structure, unspecified site: Secondary | ICD-10-CM | POA: Diagnosis not present

## 2014-03-21 DIAGNOSIS — M818 Other osteoporosis without current pathological fracture: Secondary | ICD-10-CM | POA: Diagnosis not present

## 2014-03-27 ENCOUNTER — Ambulatory Visit: Payer: Medicare Other | Admitting: Internal Medicine

## 2014-04-20 ENCOUNTER — Encounter: Payer: Self-pay | Admitting: Internal Medicine

## 2014-04-20 ENCOUNTER — Ambulatory Visit (INDEPENDENT_AMBULATORY_CARE_PROVIDER_SITE_OTHER): Payer: Medicare Other | Admitting: Internal Medicine

## 2014-04-20 VITALS — BP 123/75 | HR 101 | Temp 97.5°F | Ht 65.0 in

## 2014-04-20 DIAGNOSIS — M810 Age-related osteoporosis without current pathological fracture: Secondary | ICD-10-CM

## 2014-04-20 DIAGNOSIS — E039 Hypothyroidism, unspecified: Secondary | ICD-10-CM

## 2014-04-20 DIAGNOSIS — Z8571 Personal history of Hodgkin lymphoma: Secondary | ICD-10-CM | POA: Diagnosis not present

## 2014-04-20 DIAGNOSIS — Z720 Tobacco use: Secondary | ICD-10-CM

## 2014-04-20 DIAGNOSIS — J0100 Acute maxillary sinusitis, unspecified: Secondary | ICD-10-CM

## 2014-04-20 LAB — COMPREHENSIVE METABOLIC PANEL WITH GFR
ALT: 19 U/L (ref 0–35)
AST: 23 U/L (ref 0–37)
Albumin: 4 g/dL (ref 3.5–5.2)
Alkaline Phosphatase: 74 U/L (ref 39–117)
BUN: 17 mg/dL (ref 6–23)
CO2: 30 meq/L (ref 19–32)
Calcium: 9.6 mg/dL (ref 8.4–10.5)
Chloride: 101 meq/L (ref 96–112)
Creatinine, Ser: 0.63 mg/dL (ref 0.40–1.20)
GFR: 107.27 mL/min
Glucose, Bld: 103 mg/dL — ABNORMAL HIGH (ref 70–99)
Potassium: 4.1 meq/L (ref 3.5–5.1)
Sodium: 137 meq/L (ref 135–145)
Total Bilirubin: 0.3 mg/dL (ref 0.2–1.2)
Total Protein: 7.6 g/dL (ref 6.0–8.3)

## 2014-04-20 LAB — CBC WITH DIFFERENTIAL/PLATELET
BASOS PCT: 0.8 % (ref 0.0–3.0)
Basophils Absolute: 0.1 10*3/uL (ref 0.0–0.1)
Eosinophils Absolute: 0.2 10*3/uL (ref 0.0–0.7)
Eosinophils Relative: 2.4 % (ref 0.0–5.0)
HCT: 38 % (ref 36.0–46.0)
HEMOGLOBIN: 12.8 g/dL (ref 12.0–15.0)
LYMPHS PCT: 43.3 % (ref 12.0–46.0)
Lymphs Abs: 3.6 10*3/uL (ref 0.7–4.0)
MCHC: 33.8 g/dL (ref 30.0–36.0)
MCV: 93.5 fl (ref 78.0–100.0)
MONOS PCT: 7 % (ref 3.0–12.0)
Monocytes Absolute: 0.6 10*3/uL (ref 0.1–1.0)
NEUTROS ABS: 3.8 10*3/uL (ref 1.4–7.7)
Neutrophils Relative %: 46.5 % (ref 43.0–77.0)
Platelets: 385 10*3/uL (ref 150.0–400.0)
RBC: 4.06 Mil/uL (ref 3.87–5.11)
RDW: 13.2 % (ref 11.5–15.5)
WBC: 8.3 10*3/uL (ref 4.0–10.5)

## 2014-04-20 LAB — TSH: TSH: 7.59 u[IU]/mL — ABNORMAL HIGH (ref 0.35–4.50)

## 2014-04-20 LAB — VITAMIN D 25 HYDROXY (VIT D DEFICIENCY, FRACTURES): VITD: 14.85 ng/mL — ABNORMAL LOW (ref 30.00–100.00)

## 2014-04-20 MED ORDER — ARIPIPRAZOLE 2 MG PO TABS: 2.0000 mg | ORAL_TABLET | Freq: Every day | ORAL | Status: DC

## 2014-04-20 MED ORDER — LEVOFLOXACIN 500 MG PO TABS: 500.0000 mg | ORAL_TABLET | Freq: Every day | ORAL | Status: DC

## 2014-04-20 MED ORDER — HYDROCOD POLST-CHLORPHEN POLST 10-8 MG/5ML PO LQCR: 5.0000 mL | Freq: Two times a day (BID) | ORAL | Status: DC | PRN

## 2014-04-20 MED ORDER — NICOTINE 21 MG/24HR TD PT24: 21.0000 mg | MEDICATED_PATCH | Freq: Every day | TRANSDERMAL | Status: DC

## 2014-04-20 NOTE — Assessment & Plan Note (Signed)
She is overdue for follow up at [facility]. Encouraged her to make this appointment.

## 2014-04-20 NOTE — Progress Notes (Signed)
Subjective:    Patient ID: Teresa [name], female    DOB: 1965/10/29, 49 y.o.   MRN: [id number]  HPI  49YO female presents for follow up.  Recently had symptoms of nausea, vomiting, cough in late January. Fever at that time. Severe myalgia. Thinks her symptoms were consistent with flu.  Now having sinus pressure. Pain with touching cheeks. Left ear pain. No recent fever. Purulent sinus mucous. Not taking anything for this.  Even with PT, having progressive weakness of left leg. Plans to continue PT as she moves to [place]. Continues to use wheelchair.  Had bone density testing, which showed severe osteoporosis. Seen at [facility]. Scheduled to start Reclast.  Past medical, surgical, family and social history per today's encounter.  Review of Systems  Constitutional: Positive for fatigue. Negative for fever, chills, appetite change and unexpected weight change.  HENT: Positive for congestion, facial swelling and sinus pressure. Negative for postnasal drip, rhinorrhea, sore throat, trouble swallowing and voice change.   Eyes: Negative for visual disturbance.  Respiratory: Negative for cough, shortness of breath and wheezing.   Cardiovascular: Negative for chest pain and leg swelling.  Gastrointestinal: Negative for nausea, vomiting, abdominal pain, diarrhea and constipation.  Musculoskeletal: Positive for myalgias and arthralgias.  Skin: Negative for color change and rash.  Hematological: Negative for adenopathy. Does not bruise/bleed easily.  Psychiatric/Behavioral: Positive for sleep disturbance. Negative for dysphoric mood. The patient is not nervous/anxious.        Objective:    BP 123/75 mmHg  Pulse 101  Temp(Src) 97.5 F (36.4 C) (Oral)  Ht 5\' 5"  (1.651 m)  Wt   SpO2 99%  LMP 03/20/2001 Physical Exam  Constitutional: She is oriented to person, place, and time. She appears well-developed and well-nourished. No distress.  HENT:  Head: Normocephalic and  atraumatic.  Right Ear: External ear normal.  Left Ear: External ear normal.  Nose: Mucosal edema and rhinorrhea present. Right sinus exhibits maxillary sinus tenderness. Left sinus exhibits maxillary sinus tenderness.  Mouth/Throat: Oropharynx is clear and moist. No oropharyngeal exudate.  Eyes: Conjunctivae are normal. Pupils are equal, round, and reactive to light. Right eye exhibits no discharge. Left eye exhibits no discharge. No scleral icterus.  Neck: Normal range of motion. Neck supple. No tracheal deviation present. No thyromegaly present.  Cardiovascular: Normal rate, regular rhythm, normal heart sounds and intact distal pulses.  Exam reveals no gallop and no friction rub.   No murmur heard. Pulmonary/Chest: Effort normal and breath sounds normal. No respiratory distress. She has no wheezes. She has no rales. She exhibits no tenderness.  Musculoskeletal: Normal range of motion. She exhibits no edema or tenderness.  Lymphadenopathy:    She has no cervical adenopathy.  Neurological: She is alert and oriented to person, place, and time. She displays no atrophy. A sensory deficit (decreased sensation light touch, pinprick bilateral LE) is present. No cranial nerve deficit. She exhibits normal muscle tone. Gait (bilateral foot drop, weakness bilateral LE, uses wheelchair) abnormal. Coordination normal.  Skin: Skin is warm and dry. No rash noted. She is not diaphoretic. No erythema. No pallor.  Psychiatric: She has a normal mood and affect. Her behavior is normal. Judgment and thought content normal.          Assessment & Plan:   Problem List Items Addressed This Visit      Unprioritized   Acute maxillary sinusitis - Primary    Symptoms and exam c/w acute maxillary sinusitis. Will start Levaquin. Follow up prn  if symptoms are not improving.      Relevant Medications   levofloxacin (LEVAQUIN) tablet   chlorpheniramine-HYDROcodone (TUSSIONEX) suspension 8-10 mg/86mL   Other  Relevant Orders   CBC with Differential/Platelet   History of hodgkin's lymphoma    She is overdue for follow up at [facility]. Encouraged her to make this appointment.      Hypothyroidism    Will check TSH with labs. Continue Levothyroxine.      Relevant Orders   TSH   Osteoporosis    Reviewed recent bone density testing from [facility] showing osteoporosis. Pt is scheduled to start Reclast with Dr. Jefm Bryant. Will check Vit D with labs today. Discussed falls prevention.      Relevant Orders   Vit D  25 hydroxy (rtn osteoporosis monitoring)   Comprehensive metabolic panel   Tobacco abuse    Will start Nicotine patch 21mg . She will email or call in 1-2 weeks, when ready to decrease to 14mg  dosing.      Relevant Medications   nicotine (NICODERM CQ) patch       Return in about 3 months (around 07/19/2014) for Recheck.

## 2014-04-20 NOTE — Assessment & Plan Note (Signed)
Reviewed recent bone density testing from [facility] showing osteoporosis. Pt is scheduled to start Reclast with Dr. Jefm Bryant. Will check Vit D with labs today. Discussed falls prevention.

## 2014-04-20 NOTE — Patient Instructions (Signed)
Labs today.  Follow up in 3 months and sooner as needed.

## 2014-04-20 NOTE — Assessment & Plan Note (Signed)
Symptoms and exam c/w acute maxillary sinusitis. Will start Levaquin. Follow up prn if symptoms are not improving.

## 2014-04-20 NOTE — Assessment & Plan Note (Signed)
Will check TSH with labs. Continue Levothyroxine. 

## 2014-04-20 NOTE — Progress Notes (Signed)
Pre visit review using our clinic review tool, if applicable. No additional management support is needed unless otherwise documented below in the visit note. 

## 2014-04-20 NOTE — Assessment & Plan Note (Signed)
Will start Nicotine patch 21mg . She will email or call in 1-2 weeks, when ready to decrease to 14mg  dosing.

## 2014-04-21 ENCOUNTER — Encounter: Payer: Self-pay | Admitting: Internal Medicine

## 2014-05-01 DIAGNOSIS — M75100 Unspecified rotator cuff tear or rupture of unspecified shoulder, not specified as traumatic: Secondary | ICD-10-CM | POA: Diagnosis not present

## 2014-05-01 MED ORDER — LEVOTHYROXINE SODIUM 150 MCG PO TABS: 150.0000 ug | ORAL_TABLET | Freq: Every day | ORAL | Status: DC

## 2014-05-22 DIAGNOSIS — Z91040 Latex allergy status: Secondary | ICD-10-CM | POA: Diagnosis not present

## 2014-05-22 DIAGNOSIS — E039 Hypothyroidism, unspecified: Secondary | ICD-10-CM | POA: Diagnosis not present

## 2014-05-22 DIAGNOSIS — S93602A Unspecified sprain of left foot, initial encounter: Secondary | ICD-10-CM | POA: Diagnosis not present

## 2014-05-22 DIAGNOSIS — M21371 Foot drop, right foot: Secondary | ICD-10-CM | POA: Diagnosis not present

## 2014-05-22 DIAGNOSIS — S82492S Other fracture of shaft of left fibula, sequela: Secondary | ICD-10-CM | POA: Diagnosis not present

## 2014-05-22 DIAGNOSIS — F17200 Nicotine dependence, unspecified, uncomplicated: Secondary | ICD-10-CM | POA: Diagnosis not present

## 2014-05-22 DIAGNOSIS — M79672 Pain in left foot: Secondary | ICD-10-CM | POA: Diagnosis not present

## 2014-05-22 DIAGNOSIS — Z885 Allergy status to narcotic agent status: Secondary | ICD-10-CM | POA: Diagnosis not present

## 2014-05-22 DIAGNOSIS — Z880 Allergy status to penicillin: Secondary | ICD-10-CM | POA: Diagnosis not present

## 2014-05-22 DIAGNOSIS — S82832A Other fracture of upper and lower end of left fibula, initial encounter for closed fracture: Secondary | ICD-10-CM | POA: Diagnosis not present

## 2014-05-22 DIAGNOSIS — C8190 Hodgkin lymphoma, unspecified, unspecified site: Secondary | ICD-10-CM | POA: Diagnosis not present

## 2014-05-22 DIAGNOSIS — W1839XA Other fall on same level, initial encounter: Secondary | ICD-10-CM | POA: Diagnosis not present

## 2014-05-22 DIAGNOSIS — S82202S Unspecified fracture of shaft of left tibia, sequela: Secondary | ICD-10-CM | POA: Diagnosis not present

## 2014-05-22 DIAGNOSIS — M21372 Foot drop, left foot: Secondary | ICD-10-CM | POA: Diagnosis not present

## 2014-05-29 DIAGNOSIS — S8292XD Unspecified fracture of left lower leg, subsequent encounter for closed fracture with routine healing: Secondary | ICD-10-CM | POA: Diagnosis not present

## 2014-06-11 DIAGNOSIS — S8292XD Unspecified fracture of left lower leg, subsequent encounter for closed fracture with routine healing: Secondary | ICD-10-CM | POA: Diagnosis not present

## 2014-06-22 NOTE — Consult Note (Signed)
PATIENT NAME:  Losee, Chanele L MR#:  [id number] DATE OF BIRTH:  1965/09/24  INTERNAL MEDICINE CONSULTATION REPORT  DATE OF CONSULTATION:  11/17/2012  PRIMARY CARE PHYSICIAN:  Dr. Ronette Deter.  REFERRING PHYSICIAN: Dr. Jimmye Norman.  CONSULTING PHYSICIAN:  Demetrios Loll, MD  REASON FOR CONSULTATION: Preop evaluation.   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   The patient denies any syncope, loss of consciousness or seizure. No incontinence. No chest pain, palpitations, orthopnea. No nocturnal dyspnea. The patient was diagnosed with a right ankle fracture. According to the patient, the patient had Hodgkin's disease when she was a child. She got radiation and had an aortic scar with stenosis, pulmonary fibrosis, and nerve damage. The patient uses a walker or wheelchair at home. She back today.   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    SOCIAL HISTORY: Smokes one-half pack now and then for many years. Drinks alcohol, 1 glass of wine every day. No drug abuse.   SURGICAL HISTORY: Back surgery, tonsillectomy, C-sections, laparotomy, splenectomy, adenoidectomy, laminectomy, hysterectomy, cardiac ablation.   ALLERGIES: TO ERYTHROMYCIN, FLEXERIL, MACROBID, MORPHINE, NSAIDS,  PENICILLIN, REGLAN, ZOFRAN, Adhesive, LATEX.   HOME MEDICATIONS: Synthroid 125 mcg p.o. daily, Protonix 40 mg p.o. daily, Flexeril  1 tablet  b.i.d., Cymbalta 60 mg p.o. one capsule  once at bedtime, aspirin 325 mg p.o. daily,  albuterol CFC-free 90 mcg inhalation aerosol, 2 puffs 4 times a day p.r.n., Abilify 2 mg p.o. daily.   REVIEW OF SYSTEMS:   CONSTITUTIONAL: The patient denies any fever or chills. No headache or dizziness or weakness.  EYES: No double vision, blurry vision. EAR, NOSE, THROAT: No postnasal drip, slurred speech or dysphagia.  CARDIOVASCULAR: No chest pain, palpitation, orthopnea. No nocturnal dyspnea. No leg edema.  PULMONARY: No cough, sputum, shortness of breath or hemoptysis.  GASTROINTESTINAL: No abdominal pain, nausea, vomiting or diarrhea.  GENITOURINARY: No dysuria, hematuria, or incontinence.  SKIN: No rash or jaundice.  NEUROLOGY: No syncope, loss of consciousness or seizure.  HEMATOLOGY: No easy bruising or bleeding.  ENDOCRINOLGIC: No polyuria, polydipsia, heat or cold intolerance.   PHYSICAL EXAMINATION: VITAL SIGNS: Temperature 98, blood pressure 120/66 pulse 93, respirations 14, O2 100% on room air.  GENERAL: The patient is alert, awake, oriented, in no acute distress.  HEENT: Pupils round, equal and reactive to light and accommodation.  NECK: Supple. No JVD or carotid bruits. to light. No thyromegaly. Moist oral mucosa. Clear oropharynx.  CARDIOVASCULAR: S1, S2, regular rate and rhythm. No murmurs or gallops.  PULMONARY: Bilateral air entry. No wheezing or rales. No use of accessory muscles to breathe.  SKIN: No rash or jaundice.  NEUROLOGY: A and O x 3. No focal deficit. Power 5/5. Sensation intact.   LABORATORY DATA: WBC 11.4, hemoglobin 12.8 platelets 141, glucose 89, BUN 12, creatinine 0.75, sodium 139, potassium 4.0, chloride 108.   Right ankle x-ray shows acute ankle fracture.   EKG showed normal sinus rhythm at 98 BPM.   IMPRESSIONS: 1.  Right ankle fracture.  2.  Gastroesophageal reflux disease. 3.  Hypothyroidism.  4.  Chronic back pain.  5.  History of Hodgkin's disease, status post radiation with aortic stenosis, pulmonary fibrosis.   RECOMMENDATIONS:  The patient has a low risk for ankle surgery. We will hold aspirin and continue other home medications.   The patient needs  DVT prophylaxis after surgery. Smoking cessation was counseled for  4 to 5 minutes. I discussed the patient's condition and recommendations with the patient with the patient and the patient's daughter.   Time spent: About 48 minutes.     ____________________________ Demetrios Loll, MD qc:dm D: 11/17/2012 10:37:50 ET T: 11/17/2012 11:09:05 ET JOB#: [id number]  cc: Demetrios Loll, MD, <Dictator> Demetrios Loll MD ELECTRONICALLY SIGNED 11/17/2012 15:31

## 2014-06-22 NOTE — H&P (Signed)
Subjective/Chief Complaint Right ankle pain after fall   History of Present Illness Teresa [name] is a very pleasant female who presents to the ED from home after falling from a standing position due to a loss of balance.  This was witnessed by her brother - no LOC.  She has chronic lower extremity neuropathy and foot drop.  She has significant difficulty with balance.  Chronic pain.  History of Hodgkins disease as a teen requiring full body radiation.  Unsure of cause of foot drop and neuropathy but has been told that radiation and prior back injury have both contributed.  Patient states that she does not have good position sense with regards to her feet/ankle.  She ambulates with a walker full time and states the she really essentially is a household and very limited community ambulator. She wears AFOs b/l for her foot drop.   She does wish to proceed with surgical fixation and understands increased risk of non-union, mal-union, wound complication, and infection associated with neuropathy.  She would like to maintain current level of mobility and return to her current functional level as soon as possible.  She is with her daughter and brother.  All demonstrate good insight.   Past Med/Surgical Hx:  GERD - Esophageal Reflux:   Heart Murmer:   Aortic Insufficiency:   Aortic Stenosis:   Limited Mobility: Pt uses a walker and also will use a wheelchair. Pt has complete foot drop to her right foot and beginnings of foot drop to left foot  Chronic back pain:   Arthritis:   Anemia: with Hodgkins  Hypothyroidism:   Hodgkin's Disease:   Pulmonary Fibrosis:   Migraines:   Tonsillectomy:   Cesarean Section:   laparotomy, splenectomy:   Adenoidectomy: @ 47 months old  Laminectomy: x 2  Hysterectomy:   Cardiac Ablation:   ALLERGIES:  Erythromycin: Rash, Resp. Distress  Macrobid: Rash, Resp. Distress  Reglan: Resp. Distress  Zofran ODT: Rash  Flexeril: N/V/Diarrhea  NSAIDS:  N/V/Diarrhea  Morphine: Swelling, Hives  Penicillin: Swelling, Hives  Adhesive: Rash  Latex: Other  HOME MEDICATIONS: Medication Instructions Status  albuterol CFC free 90 mcg/inh aerosol 2 puff(s) inhaled 4 times a day x 30 days- as needed  Active  Flexeril 1   2 times a day Active  Abilify 2 mg oral tablet 1 tab(s) orally once a day Active  Protonix 40 mg oral delayed release tablet 1 tab(s) orally once a day Active  aspirin 325 mg oral tablet 1 tab(s) orally once a day Active  Synthroid 125 mcg (0.125 mg) oral tablet 1 tab(s) orally once a day Active  Cymbalta 60 mg oral delayed release capsule 1 cap(s) orally once a day (at bedtime) Active   Family and Social History:  Family History Non-Contributory   Place of Living Home   Review of Systems:  Subjective/Chief Complaint Right ankle pain   Fever/Chills No   Cough No   Sputum No   Abdominal Pain No   Diarrhea No   Constipation No   Nausea/Vomiting Yes   SOB/DOE No   Chest Pain No   Dysuria No   Tolerating Diet Nauseated   Physical Exam:  GEN well developed, no acute distress, very sleepy   HEENT pink conjunctivae, moist oral mucosa   NECK supple   RESP normal resp effort  clear BS  no use of accessory muscles   CARD regular rate  LE edema present   ABD denies tenderness  denies Flank Tenderness  normal  BS   LYMPH negative neck   EXTR positive edema   SKIN normal to palpation, No rashes   NEURO slight decreased snesation of SPN and DPN.  No dorsiflexion of b/l toes or feet.   PSYCH alert, good insight, lethargic   Lab Results: Hepatic:  18-Sep-14 09:38   Bilirubin, Total 0.2  Alkaline Phosphatase 79  SGPT (ALT) 23  SGOT (AST) 24  Total Protein, Serum 7.3  Albumin, Serum 3.5  Routine BB:  18-Sep-14 09:38   ABO Group + Rh Type O Negative  Antibody Screen NEGATIVE (Result(s) reported on 17 Nov 2012 at 10:45AM.)  Routine Chem:  18-Sep-14 09:38   Glucose, Serum 89  BUN 12   Creatinine (comp) 0.75  Sodium, Serum 139  Potassium, Serum 4.0  Chloride, Serum  108  CO2, Serum 27  Calcium (Total), Serum 9.1  Osmolality (calc) 277  eGFR (African American) >60  eGFR (Non-African American) >60 (eGFR values <38m/min/1.73 m2 may be an indication of chronic kidney disease (CKD). Calculated eGFR is useful in patients with stable renal function. The eGFR calculation will not be reliable in acutely ill patients when serum creatinine is changing rapidly. It is not useful in  patients on dialysis. The eGFR calculation may not be applicable to patients at the low and high extremes of body sizes, pregnant women, and vegetarians.)  Anion Gap  4  Routine Hem:  18-Sep-14 09:38   WBC (CBC)  11.4  RBC (CBC) 4.02  Hemoglobin (CBC) 12.8  Hematocrit (CBC) 38.3  Platelet Count (CBC) 414 (Result(s) reported on 17 Nov 2012 at 09:53AM.)  MCV 95  MCH 31.7  MCHC 33.4  RDW 13.8   Radiology Results: XRay:    18-Sep-14 08:34, Ankle Right Complete  Ankle Right Complete  REASON FOR EXAM:    fall, right ankle pain  COMMENTS:       PROCEDURE: DXR - DXR ANKLE RIGHT COMPLETE  - Nov 17 2012  8:34AM     RESULT: The patient has sustained an acute a bimalleolar fracture of the   right ankle. The medial malleolar fracture is transversely oriented   through the base of the tibia. The distal fibular fracture involves the   metadiaphysis. A posterior malleolar fracture may be present but is   difficult to assess. Given the appearance on the lateral film I suspect   that there is a posterior malleolar fracture. The ankle joint mortise is   reasonably well maintained. There is diffuse soft tissue swelling. As   best as can be determined the metatarsals are intact.    IMPRESSION:  The patient has sustained an acute bimalleolar and possibly     trimalleolar fracture of the right ankle.     Dictation Site: 2        Verified By: DAVID A. JMartinique M.D., MD    Assessment/Admission  Diagnosis Right ankle bimalleolar fracture, displaced medial malleolar fragment.   Plan NPO after midnight Plan for ORIF tomorrow Consultation with Dr. NDossie Arbour pain management  Bedrest Elevate foot   Electronic Signatures: RDawayne Patricia(MD)  (Signed 18-Sep-14 12:01)  Authored: CHIEF COMPLAINT and HISTORY, PAST MEDICAL/SURGIAL HISTORY, ALLERGIES, HOME MEDICATIONS, FAMILY AND SOCIAL HISTORY, REVIEW OF SYSTEMS, PHYSICAL EXAM, LABS, Radiology, ASSESSMENT AND PLAN   Last Updated: 18-Sep-14 12:01 by RDawayne Patricia(MD)

## 2014-06-22 NOTE — Op Note (Signed)
PATIENT NAME:  Maland, Emoree L MR#:  [id number] DATE OF BIRTH:  07/17/1965  DATE OF PROCEDURE:  11/19/2012  PREOPERATIVE DIAGNOSIS:  Right bimalleolar ankle fracture.   POSTOPERATIVE DIAGNOSIS:  Right bimalleolar ankle fracture.    PROCEDURE PERFORMED: Open reduction and internal fixation of the right ankle bimalleolar fracture.   SURGEON: Dawayne Patricia, M.D.   ASSISTANT: None.   ANESTHESIA: Spinal anesthesia.   SURGICAL FINDINGS: Bimalleolar fracture. Poor bone quality.   TOURNIQUET TIME: 90 minute.   ESTIMATED BLOOD LOSS: Minimal.   DISPOSITION: The patient will be nonweightbearing. She will be placed on aspirin postoperatively. She will be discharged home after clearing physical therapy likely over this weekend.    INDICATIONS FOR PROCEDURE: Ms. Dehaas is a 49 year old female who sustained a bimalleolar fracture to the right ankle after rolling her ankle over. At baseline, she is walker within her own house short distances in the community. She has a known diagnosis of bilateral lower extremity neuropathy and bilateral footdrop. She does have a history of total body radiation secondary to a diagnosis of Hodgkin's disease as a child. All the risks and benefits of surgery were explained to the patient. The patient was advised of the increased rate of complication including nonunion, infection, and wound complication when doing this surgery and neuropathic lower extremity. The patient demonstrates good understanding and insight. Decision was made to proceed with surgery.   DESCRIPTION OF PROCEDURE: Ms. Crumbley was identified in the preoperative holding area. She was brought into the operating room and placed on the table in the supine position. After  administration of anesthesia, the patient's right lower extremity was prepared and draped in the usual sterile fashion. Tourniquet was applied. Tourniquet was inflated to 300 mmHg. Under fluoroscopic guidance, fibular fracture was identified. A  longitudinal incision was made. Blunt dissection was carried down to bone. Please note that prior to incision, tourniquet was inflated. Dissection was carried down to bone. The fracture was identified. The fracture was found to be quite comminuted. The fracture was readily reduced. Three-hole anatomic fibular plate, right by [employer] was inserted and orthogonal radiographs were used to confirm adequate position and length. The plate was held in place with a K wire. A bicortical hole was drilled just proximal to the fracture site in the shaft of the fibula. A 16 mm cortical nonlocking screw was inserted. The patient's bone quality was found to be quite soft and only moderate purchase was achieved. Under fluoroscopic guidance, six locking unicortical screws were placed in the distal fibular fragment. Care was taken to ensure that all screws were well contained within the bone and did not penetrate the joint. At this time, a second screw was placed in the fibular shaft. The proximal screw was a 3-5 low-profile nonlocking cortical screw. A second screw was placed in the fibular shaft in a similar fashion. Given that the distal mid shaft screw did not have great purchase, the decision was made to locking screw for the second shaft screw to add another element of stability. The distal mid shaft screw was now removed, and the drill was reinserted. Drill was directed slightly distally in order to achieve better cortical purchase. Slightly better cortical purchase was achieved on reinsertion of screw. The K wire was removed. Orthogonal radiographs were taken to ensure adequate placement of all screws, adequate reduction, and good placement of the plate.   Attention was turned to the medial malleolus. A small hockey-stick incision was made along the distal aspect of the medial  malleolus. Blunt dissection was carried down to the tip of the bone. In an attempt not to strip the blood supply to the fracture site and therefore  soft tissue was preserved. Under fluoroscopic guidance, the medial malleolar piece was nicely reduced and two K wires were inserted. K wires were measured and two 4.0 mm cannulated screws that are self-drilling were placed in a standard parallel fashion. Nice compression of the medial malleolar piece was achieved. At this time, a towel clip was inserted about the fibula and was and externally rotated and pulled laterally. Cotton test was negative. External rotation test was performed under live fluoroscopy and the syndesmosis was again found to be well preserved. At this time, final radiographs were taken confirming adequate placement of hardware and confirming a symmetric mortise. The wounds were copiously irrigated. Deep tissue was closed using 0 Vicryl suture. Subcutaneous tissue was closed using 2-0 Vicryl suture. Skin was closed using a combination of a nylon suture, and staples. Sterile dressings were applied. The patient was placed in a posterior and sugar tong U-splint. The patient will be nonweightbearing. She will placed on the aspirin. She will begin physical therapy the day after the surgery.     ____________________________ Dawayne Patricia, MD sr:cc D: 11/21/2012 16:30:05 ET T: 11/21/2012 18:32:42 ET JOB#: [id number]  cc: Dawayne Patricia, MD, <Dictator> Dawayne Patricia MD ELECTRONICALLY SIGNED 11/29/2012 15:18

## 2014-06-22 NOTE — Discharge Summary (Signed)
PATIENT NAME:  Teresa [name] MR#:  [id number] DATE OF BIRTH:  [birth year]  DATE OF ADMISSION:  11/17/2012 DATE OF DISCHARGE: 11/19/2012.   ADMITTING DIAGNOSIS:  Bimalleolar ankle fracture, right.   DISCHARGE DIAGNOSIS:  Bimalleolar ankle fracture, right ankle.   PROCEDURE:  Open reduction and internal fixation of right bimalleolar ankle fracture.   SURGEON:  Dawayne Patricia, MD.   ASSISTANT:  None.   ANESTHESIA:  Spinal.   ESTIMATED BLOOD LOSS:  Minimal.   OPERATIVE FINDINGS:  Bimalleolar fracture with poor bone quality.   DRAINS:  None.   IMPLANTS:  Biomet.   COMPLICATIONS:  None.   HISTORY OF PRESENT ILLNESS:  The patient is a very pleasant female who presents to the ED from and after falling from a standing position due to a loss of balance. This was witnessed by her brother with no loss of consciousness. She has chronic lower extremity neuropathy and foot drop. She has significant difficulty with balance. Chronic pain. A history of Hodgkin's disease as a teen requiring full body irradiation. Unsure of cause of foot drop and neuropathy, but has been told that radiation and prior back injury has both contributed to it. The patient states that she does have good position sense with regards to her feet and ankle. She ambulates with a walker full time and states that she really essentially is household and very limited to community ambulator. She wears AFOs bilaterally for her foot drop. She does wish to proceed with surgical fixation understands increased risk of nonunion, malunion, wound complications and infection associated with neuropathy. She would like to maintain current level of mobility and return to her current function level as soon as possible. She is with her daughter and her brother and all demonstrate good insight.   PHYSICAL EXAMINATION: GENERAL:  Well developed, in no acute distress. Very sleepy.  HEENT:  Pink conjunctivae, moist oral mucosa.  NECK:  Supple.   RESPIRATORY:  Normal respiratory effort. Clear breath sounds. No use of accessory muscles.  CARDIOVASCULAR:  Regular rate. Lower extremity edema present.  ABDOMEN:  Denies tenderness. Denies flank tenderness. LYMPHATICS: Normal lymph nodes, negative cervical lymphadenopathy.  EXTREMITIES:  Positive edema.  SKIN:  Normal to palpation. No rashes. NEUROLOGIC:  Slight decreased sensation of SPN and DPM. No dorsiflexion of bilateral toes or feet.  PSYCHIATRIC:  Alert, and good insight and lethargic.   HOSPITAL COURSE:  The patient was admitted to the hospital on 11/17/2012. After clearance for surgery, she underwent an ORIF of the right bimalleolar fracture on 11/18/2012 with Dr. Joie Bimler. The patient was brought to the Orthopaedic floor from the PACU in stable condition. On postoperative day 1, the patient was found to have acute postop blood loss anemia with a hemoglobin of 11.1. On postoperative day 2, hemoglobin trended down to 10.4. The patient was stable. Vital signs are stable. She had no chest pain, shortness of breath or dizziness with ambulation. She had good progress with physical therapy, and after discussion with Care Management, she was able to receive a sliding board wheelchairs and another necessary equipment to make sure that she was able to return home safely. The patient was in agreement for discharge home with home therapy.   CONDITION AT DISCHARGE:  Stable.   DISCHARGE INSTRUCTIONS:  1.  She should not put any weight on the affected extremity.  2.  She is to elevate the affected foot or leg on 1 or 2 pillows with the foot higher than the knee. 3.  She  may resume a regular diet as tolerated.  4.  Apply an ice pack to the affected area. Do not get the dressing or bandage wet or dirty. Call [facility] if the dressing gets water under it. Leave the dressing on. Call [facility] if any of the following occur:  Bright red bleeding from the incision wound, fever above 101.5  degrees, redness, swelling, or drainage at the incision. Call [facility] if you experience any increased leg pain, numbness or weakness in your legs or any bowel or bladder symptoms.  5.  Home physical therapy has been arranged for continuation of the rehab program. Please call [facility] if a therapist has not contacted her within 48 hours of her return home. Continue with home health PT at the beginning of the day.  6.  She needs to followup with [facility] in 7 to 10 days for skin check and cast application.  7.  Followup physician is Dr. Dawayne Patricia.  DISCHARGE MEDICATIONS:  1.  Synthroid 125 mcg oral tablet 1 tablet orally once a day.  2.  Cymbalta 60 mg oral delayed-release capsule one cap orally once a day.  3.  Albuterol 90 mcg inhalation aerosol 2 puffs 4 times a day as needed.  4.  Flexeril 10 mg 1 tablet 2 times a day. 5.  Abilify 2 mg oral tablet 1 tablet orally once a day.  6.  Protonix 40 mg oral delayed-release tablet 1 tablet orally once a day.  7.  Aspirin 325 mg oral tablet 1 tablet orally once a day.  8.  Vicodin 7.5/325, 1 tablet orally every 4 to 6 hours as needed for pain.  9.  Magnesium hydroxide 8% oral suspension 30 mL orally 2 times a day as needed for constipation.  10.  Calcium and vitamin D 500 mg, 200 international units 1 tablet orally 2 times a day with meals.   ____________________________ T. Rachelle Hora, PA-C tcg:jm D: 11/19/2012 15:13:42 ET T: 11/19/2012 16:05:54 ET JOB#: [id number]  cc: T. Rachelle Hora, PA-C, <Dictator> Duanne Guess [place] ELECTRONICALLY SIGNED 11/23/2012 17:09

## 2014-06-23 NOTE — Consult Note (Signed)
PATIENT NAME:  Teresa, Teresa [name] MR#:  [id number] DATE OF BIRTH:  1966-01-27  DATE OF CONSULTATION:  05/03/2013  REFERRING PHYSICIAN:  Phillips Climes, MD  CONSULTING PHYSICIAN:  Cheral Marker. Ola Spurr, MD  REASON FOR CONSULTATION: Sepsis and asplenia.    HISTORY OF PRESENT ILLNESS: This is a very pleasant 49 year old female, with history of prior Hodgkin lymphoma status post chemo and splenectomy in the 1980s, who was in her usual state of health until yesterday when she developed sudden onset of fevers, generalized body weakness, body aches and rigors. She then developed vomiting and was brought to the Emergency Room where she was found to have a leukocytosis as well as hypotension and tachycardia. Temperature was 101.7. The patient was admitted and started on IV antibiotics. Cultures are pending.   The patient reports that about 1 month ago, she had a flulike illness. She did not get flu tested  and did not take Tamiflu; however, within a few days, she developed worsening symptoms and was diagnosed with pneumonia based on an outpatient chest x-ray. She was treated with an antibiotic but cannot recall the name of it. She actually improved and was fine until this new-onset illness. Her main complaints now are sinus congestion and sore throat. The patient denies any rash, headaches, neck stiffness, or cough.   PAST MEDICAL HISTORY:  1.  Hodgkin lymphoma in 1984 status post treatment with chemoradiation at [facility].  2.  Asplenia.  3.  Hypothyroidism.  4.  Back injury with nerve damage, chronic pain, and peripheral neuropathy.  5.  Cervical dysplasia requiring hysterectomy in 2003.  6.  Elevated cholesterol.  7.  Paraplegia, right greater than left, due to peripheral neuropathy and multiple lumbar spine surgeries.  8.  GERD.  9.  Anxiety.  10.  Depression.   PAST SURGICAL HISTORY:  1.  Splenectomy, 1984. 2.  Appendectomy in 1984.  3.  Back surgeries, 2004 to 2011. 4.  Hysterectomy, 2003.  5.   Tonsillectomy.  6.  Hemorrhoidectomy.   FAMILY HISTORY: Significant for colon cancer in maternal grandfather.   SOCIAL HISTORY: The patient's 65 year old daughter lives with her. She does also have a grandchild at home. She quit smoking about 4 months ago. Denies alcohol or drugs.   ALLERGIES: ERYTHROMYCIN, FLEXERIL, MACROBID, MORPHINE, NSAIDS, PENICILLIN, REGLAN, ZOFRAN, ADHESIVE, AND LASIX.   MEDICATIONS: Synthroid, Cymbalta, and lisinopril.   ANTIBIOTICS SINCE ADMISSION: Include ceftriaxone, vancomycin, and levofloxacin.   REVIEW OF SYSTEMS: Eleven systems negative except as per HPI.   PHYSICAL EXAMINATION:  VITAL SIGNS: T-max 101.7 on admission,  98.2, pulse 106, blood pressure 115/66, respirations 17, sat 97% on room air.  GENERAL: Obese, pleasant, in mild distress, sitting in bed in the CCU.  HEENT: Pupils equal, round and reactive to light and accommodation. Extraocular movements are intact. Sclerae are anicteric.  OROPHARYNX: Has pustules on her posterior pharynx. She has no thrush or other lesions. Mild anterior cervical tender lymphadenopathy.  HEART: Regular.  LUNGS: Clear.  ABDOMEN: Soft, nontender, nondistended. No hepatosplenomegaly.  EXTREMITIES: No clubbing, cyanosis or edema.  NEUROLOGIC: Bilateral foot drop, otherwise neurologically intact.   LABORATORY AND DIAGNOSTICS: Labs are reviewed. Urinalysis showed less than 1 Rudder cell, lactic acid 1.1. Blood cultures x2 are pending. BUN 17, creatinine 0.88. LFTs normal except albumin down at 3.0. Lie count 18.1 with a neutrophil count 15.6, hemoglobin 11.3, platelets 344. Influenza A and B antigen tests negative.   Chest x-ray negative for pneumonia.   Central line was placed.   Followup  chest x-ray showed no pneumothorax.   IMPRESSION: This is a 49 year old asplenic patient admitted with rather sudden onset of sepsis following a sore throat. She likely has a sepsis related to pharyngitis, possibly due to group  streptococcus. She has no signs of meningitis or hepatitis or other sources of infection. She has not had any recent procedures.   RECOMMENDATIONS:  1.  Continue current vanco, ceftriaxone, and levofloxacin.  2.  Await blood cultures.  3.  I have put in an order for a throat swab for rapid strep. This may give [place] an organism.  4.  Continue support for her sepsis.   Thank you for the consult. I would be glad to follow with you.    ____________________________ Cheral Marker. Ola Spurr, MD dpf:np D: 05/03/2013 15:42:58 ET T: 05/03/2013 17:38:04 ET JOB#: [id number]  cc: Cheral Marker. Ola Spurr, MD, <Dictator> DAVID Ola Spurr MD ELECTRONICALLY SIGNED 05/08/2013 19:14

## 2014-06-23 NOTE — H&P (Signed)
PATIENT NAME:  [name], [name] MR#:  [id number] DATE OF BIRTH:  01-Dec-1965  DATE OF ADMISSION:  10/23/2013  ADDENDUM:   ASSESSMENT AND PLAN:  Incidental finding of left lung 6 mm nodular opacity; we will check a CT chest without contrast.     ____________________________ Albertine Patricia, MD dse:MT D: 10/24/2013 01:03:58 ET T: 10/24/2013 06:46:44 ET JOB#: [id number]  cc: Albertine Patricia, MD, <Dictator> Xayne Brumbaugh Graciela Husbands MD ELECTRONICALLY SIGNED 10/26/2013 0:46

## 2014-06-23 NOTE — Op Note (Signed)
PATIENT NAME:  [name], [name] MR#:  [id number] DATE OF BIRTH:  [birth year]  DATE OF PROCEDURE:  05/03/2013  PREOPERATIVE DIAGNOSIS: Sepsis, poor intravenous access.  POSTOPERATIVE DIAGNOSIS: Sepsis, poor intravenous access.  PROCEDURE PERFORMED: Ultrasound-guided right internal jugular triple lumen catheter placement.  ESTIMATED BLOOD LOSS: Minimal.   COMPLICATIONS: None.   SPECIMENS: None.   INDICATIONS FOR PROCEDURE: [name] is a pleasant 49 year old female, who presents with fever, tachycardia, poor IV access and a history of Hodgkin's lymphoma. I was consulted for central venous catheter placement due to the patient's poor IV access.   DETAILS OF PROCEDURE: As follows: Informed consent was obtained. [name] was brought to operating room and was laid supine on the stretcher. Her right neck was prepped and draped in standard surgical fashion. A timeout was then performed correctly identifying the patient name, operative site and procedure to be performed. An ultrasound was used to isolate and large internal jugular vein. I then infiltrated the area with 0.5% lidocaine with epinephrine. The internal jugular was accessed with a sterile needle. With 1 stick a wire was placed through the needle. The needle was removed. The wire tract was dilated and flushed. A triple-lumen was then placed over the wire. The wire was withdrawn. All 3 ports were flushed and aspirated. The central line was sutured in place. A sterile dressing was placed over the central line. A postprocedure x-ray showed central line in appropriate position at approximately the cavoatrial junction. There was no pneumothorax. The drapes were then taken down. Needle, sponge, and instrument counts were correct at the end of the procedure. There were no complications.   ____________________________ Glena Norfolk Khrystal Jeanmarie, MD cal:aw D: 05/03/2013 00:40:52 ET T: 05/03/2013 08:45:59 ET JOB#: [id number]  cc: Harrell Gave A. Liel Rudden, MD,  <Dictator> Floyde Parkins MD ELECTRONICALLY SIGNED 05/08/2013 12:29

## 2014-06-23 NOTE — Discharge Summary (Signed)
PATIENT NAME:  Teresa [name], Teresa [name] MR#:  [id number] DATE OF BIRTH:  February 23, 1966  DATE OF ADMISSION:  10/23/2013  DATE OF DISCHARGE:  10/24/2013  PRESENTING COMPLAINT: Fever and abdominal pain.   DISCHARGE DIAGNOSES:  1.  Acute pyelonephritis.  2.  Bilateral pulmonary nodules noted on CT chest, incidental.  3.  History of smoking.  4.  Hypothyroidism.   CODE STATUS: Full code.   MEDICATIONS:  1.  Synthroid 125 mcg p.o. daily.  2.  Cymbalta 60 mg extended release p.o. daily at bedtime.  3.  Multivitamin daily.  4.  Acetaminophen with oxycodone 5/325 one every 4 hours as needed.  5.  TUMS 2 tablets at bedtime.  6.  Levaquin 750 mg p.o. daily.  7.  Resume previous home health orders.   FOLLOWUP:  1.  With Dr. Ronette Deter 1 to 2 weeks.  2.  Follow up with Dr. Rudene Christians on your appointment.    LABORATORY DATA:    Strep negative.  CT of the chest without contrast shows multiple small pulmonary nodules noted bilaterally with largest measuring 7 mm in the right middle lobe.  If the patient is at high risk for bronchogenic carcinoma, follow-up CT chest in 3 to 6 months is recommended.  Ultrasound of the abdomen is negative.  Urinalysis positive for urinary tract infection.   Lactic acid 1.9. Mckee count 12.0. Blood cultures negative in 48 hours.   HOSPITAL COURSE:  [name] is a 49 year old Caucasian female who came into the Emergency Room with:  1.   Acute pyelonephritis, urinary tract infection and mild sepsis.  Sepsis was present at admission, improved.  Blood cultures remained negative.  The patient presents with urinary tract infection. She was started on IV Rocephin and changed to p.o. Levaquin. The patient was insisting to go home, since it is her grandchild's birthday.  She is advised to stay, but refuses to do so.   So far she is afebrile. Duris count stable, eating okay. Blood pressure is stable.  2.  Hypothyroidism. Continue Synthroid.  3.  Hyperlipidemia, on statins.  4.   Gastroesophageal reflux disease.  Continue PPI.  5.  Bilateral lung nodules noted on chest x-ray and CT scan was incidental. She has history of smoking and have advised her to strongly follow up with Dr. Ronette Deter for further work-up.  6.  History of depression, anxiety. resume home meds.   Hospital stay otherwise remained stable.   CODE STATUS: The patient remained a full code.   TIME SPENT: 40 minutes.     ____________________________ Hart Rochester Posey Pronto, MD sap:DT D: 10/26/2013 13:32:53 ET T: 10/26/2013 14:55:38 ET JOB#: [id number]  cc: Saheed Carrington A. Posey Pronto, MD, <Dictator> Unknown Ilda Basset MD ELECTRONICALLY SIGNED 11/07/2013 14:51

## 2014-06-23 NOTE — H&P (Signed)
PATIENT NAME:  Teresa [name], Teresa [name] MR#:  [id number] DATE OF BIRTH:  Feb 08, 1966  DATE OF ADMISSION:  10/23/2013  PRIMARY CARE PHYSICIAN: Anderson Malta A. Gilford Rile, MD    CHIEF COMPLAINT: Fever; abdominal pain.   HISTORY OF PRESENT ILLNESS: This is a 49 year old female with a known history of Hodgkin lymphoma, treated with dual chemotherapy and radiation in 1984, status post splenectomy,  hypothyroidism, with known history of chronic lower extremities due to back injury. The patient presents with fever x 3 days; reports most recent was today at 101, which she did take some Tylenol for. As well, reports she has been having abdominal pain on the right side, reports some chills as well. Reports nausea and vomiting. Denies any diarrhea, any melena, any coffee-ground emesis, any dysuria, or polyuria. Upon presentation, the patient was afebrile, but  did have some tachycardia. As well, the patient had mild leukocytosis.   The patient's urinalysis was positive. Her chest x-ray did show a 6 mm nodular opacity in the left lower lobe with the recommendation of CT thorax. The patient denies any cough or any productive sputum. The patient had blood cultures sent and was started on broad-spectrum IV antibiotics, give her known history of splenectomy and the hospitalist requested to admit the patient.   PAST MEDICAL HISTORY:  1.  Hodgkin lymphoma 1984; status post chemotherapy and radiation at [facility].  2.  Hypothyroidism.  3.  Back injury with nerve damage down into both legs; chronic pain.  4.  Cervical dysplasia requiring hysterectomy in 2003.  5.  Elevated cholesterol.  6.  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  7.  GERD.  8.  Anxiety.  9.  Depression.   PAST SURGICAL HISTORY: Tonsillectomy, splenectomy, appendectomy, 2 back surgeries, hysterectomy with bilateral salpingo-oophorectomy, hemorrhoidectomy (rubber band procedure).   FAMILY HISTORY: Significant for colon cancer in her  maternal grandfather.   SOCIAL HISTORY: Denies any smoking, drug or alcohol use, and quit smoking last year.   ALLERGIES: ERYTHROMYCIN, FLEXERIL, MACROBID, MORPHINE, NSAIDS, PENICILLIN, REGLAN, ZOFRAN, ADHESIVE, LASIX, DILAUDID.   HOME MEDICATIONS: TUMS 2 tablets at bedtime, Cymbalta 60 mg daily, multivitamin 1 tablet daily, Synthroid 125 mcg daily, Protonix 40 mg at bedtime.   REVIEW OF SYSTEMS:  CONSTITUTIONAL: The patient reports fever, chills, fatigue, weakness, poor appetite.  EYES: Denies blurred vision, double vision, inflammation, glaucoma.  EARS, NOSE, AND THROAT: Denies tinnitus, ear pain, hearing loss.  Denies runny nose or nasal discharge. Reports history of a sore throat a few days ago.  RESPIRATORY: Denies any cough, any productive sputum, any COPD, any wheezing.  CARDIOVASCULAR: Denies chest pain, edema, palpitation, syncope.  GASTROINTESTINAL: Reports nausea and vomiting and abdominal pain. Denies any diarrhea,  hematemesis, melena.  GENITOURINARY: Denies dysuria, hematuria, renal colic.  ENDOCRINE: Denies polyuria, polydipsia, heat or cold intolerance.  HEMATOLOGY: Denies anemia, easy bruising, bleeding diathesis, history of Hodgkin lymphoma, status post a splenectomy.  INTEGUMENT: Denies acne, rash, or skin lesion.  MUSCULOSKELETAL: Denies any cramps, swelling, gout.  NEUROLOGIC: Denies any history of CVA, TIA, or any new focal deficits, tingling or numbness.  PSYCHIATRIC: Denies anxiety, insomnia or depression, substance abuse or alcohol abuse.   As well, the patient was complaining of abdominal pain, which ultrasound did not show any acute findings. Actually, the patient was noticed to have right CVA tenderness on the physical exam is well. The patient reports she had a recent orthopedic procedure by Dr. Rudene Christians on 10/18/2013, where she had a Wright wire from previous  surgery removed from her ankle secondary to pain. She denies any sweating or any tenderness in that leg.    PHYSICAL EXAMINATION:  VITAL SIGNS: Temperature 98.2, pulse 91, respiratory rate 18, blood pressure 135/65, saturation 100% on room air.  GENERAL: Well-nourished female who looks comfortable in bed, in no apparent distress.  HEENT: Head atraumatic, normocephalic. Pupils equal and reactive to light. Pink conjunctivae. Anicteric sclerae. Moist oral mucosa. No oral thrush. No pharyngeal erythema. No nasal drainage or bleed.  NECK: Supple. No thyromegaly. No JVD. No nuchal rigidity. No cervical lymphadenopathy.  CHEST: No chest tenderness to palpation.  LUNGS: Good air entry bilaterally. No wheezing, rales, rhonchi. No use of accessory muscles.  CARDIOVASCULAR: S1, S2 heard. No rubs, murmur, or gallops. PMI nondisplaced. ABDOMEN: Soft, nontender, nondistended. Bowel sounds present. No hepatosplenomegaly. Has right CVA tenderness to palpation.  EXTREMITIES: No edema. No clubbing. No cyanosis. Pedal pulses +2 bilaterally. Has mild right foot drop. Has a bandage around her ankle from recent surgery, which was examined. She has 2 incisional wounds, which are stable. The longer one on the lateral malleolar area with no drainage or oozing or signs of infection. As well, a smaller one in the medial malleolar area with no signs of infection, oozing or bleeding noticed. Pedal pulses +2 bilaterally.  PSYCHIATRIC: Appropriate affect. Awake, alert x 3. Intact judgment and insight.  NEUROLOGIC: Cranial nerves grossly intact.  MOTOR: Upper 5/5, bilateral; lower 4/5. Sensation is symmetrical and intact to light touch in all extremities.  SKIN: Normal skin turgor. Warm and dry.  MUSCULOSKELETAL: No joint effusion or erythema could be appreciated. Has right CVA tenderness.   PERTINENT LABORATORY DATA: Glucose 95, BUN 8, creatinine 0.9, sodium 140, potassium 3.3, chloride 106, CO2 of 27. Troponin less than 0.02. Wheeler blood cells 12, hemoglobin 13.3, hematocrit 40.5, platelets 347,000. Urinalysis: Trace leukocyte  esterase and 18 Bouknight blood cells. Pregnancy test is negative. Lactic acid 1.9.   IMAGING:  1.  Chest x-ray showing a 6 mm nodular opacity in the left lower lobe.  2.  Ultrasound showing no acute abdominal findings.   ASSESSMENT AND PLAN:  1.  Acute pyelonephritis. The patient presents with urinary tract infection with right cerebrovascular accident tenderness. Given her splenectomy, initially she be started on broad-spectrum antibiotics, which we can down escalate later, when her septic workup is back. We will follow on the blood culture, as well. We will follow on the urine culture. Meanwhile, she will be kept on vancomycin, Rocephin, and levofloxacin. We will check as well, rapid Strep test of throat, as she reports a history of a sore throat a few days ago as well. The patient had recent surgery, but site looks clean and does not have any signs of infection.  2.  Hypothyroidism. Continue with Synthroid.  3.  Hyperlipidemia. Continue with statin.  4.  Gastroesophageal reflux disease. Continue with PPI.  5.  History of depression and anxiety. Resume her back on her home medications for DVT prophylaxis, subcutaneous heparin.   CODE STATUS: The patient is Full Code.   TOTAL TIME SPENT ON ADMISSION AND PATIENT CARE: 50 minutes     ____________________________ Albertine Patricia, MD dse:MT D: 10/24/2013 00:59:38 ET T: 10/24/2013 06:26:04 ET JOB#: [id number]  cc: Albertine Patricia, MD, <Dictator> DAWOOD Graciela Husbands MD ELECTRONICALLY SIGNED 10/26/2013 0:46

## 2014-06-23 NOTE — Op Note (Signed)
PATIENT NAME:  Teresa [name], Teresa [name] MR#:  [id number] DATE OF BIRTH:  October 15, 1965  DATE OF PROCEDURE:  10/18/2013  PREOPERATIVE DIAGNOSIS: Painful hardware, right ankle, medial and lateral.   POSTOPERATIVE DIAGNOSIS: Painful hardware, right ankle, medial and lateral.   PROCEDURE: Removal of deep hardware, right ankle.   ANESTHESIA: General.   SURGEON: Laurene Footman, MD   DESCRIPTION OF PROCEDURE: The patient was brought to the operating room and after adequate anesthesia was obtained, the right leg was prepped and draped in the usual sterile fashion with a tourniquet applied to the upper thigh. After patient identification and timeout procedures were completed, the tourniquet was raised to 300 mmHg. Using the prior scars, first the lateral plate was exposed through the lateral incision. All screws were removed and the plate removed without difficulty. The fracture was healed.  There was some bony ingrowth through  some of the screw holes that had not been filled. Rongeur was used to smooth this off.  There was quite a bit of bursal layer overlying the plate consistent with her symptoms. The wound was irrigated and then closed with 2-0 Vicryl subcutaneously and skin staples. Going medially, the prior oblique incision was made over the medial malleolus. Skin and subcutaneous tissue were spread and the heads of the screw were identified deep in the deep tissue and removed without difficulty. The wound again was irrigated and the wound closed with 2-0 Vicryl subcutaneously and skin staples. Xeroform, 4 x 4s, Webril and Ace wrap were applied, and the patient was sent to recovery room in stable condition.   ESTIMATED BLOOD LOSS: Minimal.   COMPLICATIONS: None.   SPECIMEN: None. Hardware was discarded per patient request.  TOURNIQUET TIME: 30 minutes at 300 mmHg.    ____________________________ Laurene Footman, MD mjm:LT D: 10/18/2013 12:75:17 ET T: 10/18/2013 21:45:44 ET JOB#: [id number]  cc: Laurene Footman, MD, <Dictator> Laurene Footman MD ELECTRONICALLY SIGNED 10/19/2013 9:50

## 2014-06-23 NOTE — H&P (Signed)
PATIENT NAME:  Grindstaff, Jama L MR#:  [id number] DATE OF BIRTH:  Aug 03, 1965  DATE OF ADMISSION:  05/03/2013  REFERRING PHYSICIAN: Dr. Marjean Donna.     PRIMARY CARE PHYSICIAN: Dr. Ronette Deter.   CHIEF COMPLAINT: Fever, cough.   HISTORY OF PRESENT ILLNESS: This is a 49 year old female with known past medical history for Hodgkin lymphoma treated with [facility] chemotherapy and radiation therapy in 1984, status post splenectomy, hypothyroidism, with known chronic lower extremity weakness due to back injury, presents with fever, generalized body weakness, not feeling well.  She reports all her symptoms started yesterday afternoon, when she had some chills, rigors, fever, as well she reports she had body ache, not feeling well. Had nausea and vomiting x 1. Denies any headache, any blurry vision, any neck stiffness, any new focal weakness or numbness. The patient is known to have baseline lower back pain due radiculopathy. The patient has significant leukocytosis at  18,000 in the ED, as well, she was hypotensive, but did respond to fluid boluses. The patient's urinalysis was negative. Chest x-ray did not show any acute findings. The patient reports upper respiratory symptoms recently, including runny nose and discharge and cough with productive sputum. Her influenza was negative. The patient was started on broad-spectrum IV antibiotics, vancomycin and Levaquin in the ED. Rocephin was added.  Hospitalists were requested to admit the patient for SIRS.   PAST MEDICAL HISTORY: 1.  Hodgkin lymphoma in 1984, status post treatment with chemo and radiation at [facility].  2.  Hypothyroidism.  3.  Back injury with nerve damage down to both legs, chronic pain.  4.  Cervical dysplasia requiring hysterectomy in 2003.  5.  Elevated cholesterol.  6.  Paraplegia right worse than left after multiple lumbar spine interventions.  7.  GERD.  8.  Anxiety.  9.  Depression.   PAST SURGICAL HISTORY: 1.  Tonsillectomy.  2.   Splenectomy in 1984.  3.  Appendectomy 1984.  4.  Two back surgeries in 2004 and 2011.  5.  Hysterectomy with BSO in 2003.  6.  Hemorrhoidectomy, rubber band procedure.   FAMILY HISTORY: Significant for colon cancer in her maternal grandfather.   SOCIAL HISTORY: Denies any smoking, any drugs, and alcohol abuse.  The  patient quit smoking 4 months ago.   ALLERGIES: ERYTHROMYCIN, FLEXERIL, MACROBID, MORPHINE, NSAIDS, PENICILLIN, REGLAN, ZOFRAN, ADHESIVE, LASIX.   HOME MEDICATIONS: 1.  Synthroid 125 mcg oral daily.  2.  Cymbalta 60 mg oral daily.  3.  Lisinopril oral daily.   REVIEW OF SYSTEMS:  CONSTITUTIONAL: The patient reports fever, fatigue, chills, poor appetite.  EYES: Denies blurry vision, double vision, inflammation, glaucoma.  ENT: Denies tinnitus, ear pain, hearing loss. Reports runny nose and nasal discharge.  RESPIRATORY: Reports cough and productive sputum. Denies any COPD, any wheezing.  CARDIOVASCULAR: Denies chest pain, edema, palpitations, syncope.  GASTROINTESTINAL: Reports nausea and one time vomiting. Denies any diarrhea, abdominal pain, hematemesis, melena.  GENITOURINARY: Denies dysuria, hematuria, or renal colic.  ENDOCRINE: Denies polyuria, polydipsia, heat or cold intolerance.  HEMATOLOGY: Denies anemia, easy bruising, bleeding diathesis. Reports history of Hodgkin lymphoma and splenectomy.  INTEGUMENTARY: Denies acne, rash or skin lesion.  MUSCULOSKELETAL: Denies any cramps, swelling, gout. Reports generalized body aches.  NEUROLOGIC:  Denies any new focal deficits, tingling, numbness, focal deficit.  Denies any history of CVA, TIA, tremors.  Denies any meningeal signs.  PSYCHIATRIC:  Denies substance abuse, alcohol abuse or insomnia. Reports history of depression and anxiety.   PHYSICAL EXAMINATION: VITAL SIGNS:  Temperature 101.7, pulse 116, respiratory rate 22, blood pressure 80/39, saturating 98% on room air.  GENERAL: A well-nourished female who looks  comfortable in no apparent distress.  HEENT: Head atraumatic, normocephalic.  Pupils equal and reactive to light. Pink conjunctivae. Anicteric sclerae. Dry oral mucosa.  NECK: Supple. No thyromegaly. No JVD. Has right triple-lumen catheter, IJ line. Has no nuchal rigidity.  CHEST: Good air entry bilaterally. No wheezing, rales or rhonchi.  CARDIOVASCULAR: S1, S2 heard. No rubs, murmur or gallops.  ABDOMEN: Soft, nontender, nondistended. Bowel sounds present.  EXTREMITIES: No edema. No clubbing. No cyanosis.  Pedal pulses +2 bilaterally. Has mild right foot drop.  PSYCHIATRIC: Appropriate affect. Awake, alert x 3. Intact judgment and insight. Cranial nerves are grossly intact. Motor: Upper extremities 5 out of 5.  Bilateral lower extremity 4 out of 5.  Sensation is symmetrical and intact to light touch in all extremities.  SKIN: Normal skin turgor. Warm and dry.  MUSCULOSKELETAL: No joint effusion or erythema could be appreciated.   PERTINENT LABORATORIES: Glucose 95, BUN 17, creatinine 0.88, sodium 136, potassium 3.7, chloride 103, CO2 27, ALT 23, AST 18, alkaline phosphatase 85. [place] blood cells 18.1, hemoglobin 11.3, hematocrit 34.3, platelet 344. Influenza negative.  Urinalysis negative for leukocyte esterase and nitrite. Chest x-ray:  No evidence for pneumonia.   ASSESSMENT AND PLAN: 1.  Systemic inflammatory response syndrome. The patient presents with systemic inflammatory response syndrome criteria. She is hypotensive. She is tachycardic, She is febrile with leukocytosis. So for etiology is unclear. She has negative urinalysis and negative chest x-ray. Her meningeal signs are negative as well. She has negative ingluenza . From her description, it appears to be a viral syndrome as she is having generalized body ache with runny nose but she is influenza negative and given she is an asplenic patient, we will start her on broad-spectrum IV antibiotic. We will start her on IV vancomycin and IV  Rocephin at 2 grams every 12 hours. We will consult Infectious Disease in the morning. We will follow on the urine cultures. We will continue with aggressive hydration for her low blood pressure, and if needed, will start her on pressors.  2.  Hypothyroidism. We will resume her back on Synthroid.  3.  Hyperlipidemia: Will continue her on statin.  4.  Gastroesophageal reflux disease. Continue with proton pump inhibitor.  5.  History of depression and anxiety. Continue with home meds.  6.  Deep vein thrombosis prophylaxis. Subcutaneous heparin.  7.  History of hypertension.  The patient is currently hypotensive. We will hold all her meds.  8.  CODE STATUS: Discussed with the patient.  She is FULL CODE.   TOTAL TIME SPENT ON ADMISSION AND PATIENT CARE: 55 minutes.    ____________________________ Albertine Patricia, MD dse:dp D: 05/03/2013 03:16:33 ET T: 05/03/2013 06:40:59 ET JOB#: [id number]  cc: Albertine Patricia, MD, <Dictator> Margel Joens Graciela Husbands MD ELECTRONICALLY SIGNED 05/04/2013 9:05

## 2014-06-23 NOTE — Discharge Summary (Signed)
PATIENT NAME:  [name], [name] MR#:  [id number] DATE OF BIRTH:  [birth year]  DATE OF ADMISSION:  05/03/2013 DATE OF DISCHARGE:  05/07/2013  PRESENTING COMPLAINT: Fever and cough.   DISCHARGE DIAGNOSES: 1.  Septic shock secondary to severe acute pharyngitis, improved.  2.  Severe acute exudative pharyngitis, improved.   CODE STATUS: Full code.   MEDICATIONS: 1.  Synthroid 125 mcg p.o. daily.  2.  Cymbalta 60 mg p.o. daily.  3.  Protonix 40 mg daily.  4.  Lisinopril p.o. daily, resume the home dose.  5.  Dilaudid 2 mg 1 tablet every 8 hours as needed.  6.  Phenol topically 3 sprays every 2 hours as needed for sore throat.  7.  Magnesium oxide 400 mg 1 tablet daily.  8.  Levaquin 20 mL every 24 hours.  9.  Chlorpheniramine/hydrocodone 5 mL b.i.d. as needed.   DISCHARGE INSTRUCTIONS:  1.  Resume your home health at discharge.  2.  Mechanical soft diet.  3.  Follow up with Dr. Ronette Deter in 1 to 2 weeks.  4.  Follow up with Dr. Ola Spurr in 1 to 2 weeks.   CONSULTATIONS:   1.  ENT consultation with Dr. Pryor Ochoa.  2.  Surgical consultation with Dr. Rexene Edison. 3.  ID consultation with Dr. Ola Spurr.   LABORATORY AND RADIOLOGICAL DATA AT DISCHARGE: Left knee: No acute bony abnormality. wbc is 9.3. Magnesium 1.2, potassium 3.9. Chest x-ray: No acute cardiopulmonary abnormality. Danley count is 9.6; hemoglobin and hematocrit are 9.8 and 29.7. Creatinine is 0.63; sodium is 140; potassium is 3.8. Rapid HIV is negative. Strep culture: Group B culture was negative. Lactic acid is 1.1. Urinalysis negative for UTI. Blood cultures negative. Divita count on admission was 18.1.   BRIEF SUMMARY OF HOSPITAL COURSE: Teresa [name] is a 48 year old Caucasian female with past medical history of Hodgkin's lymphoma in 1984, status post splenectomy, presents with complaints of fever, generalized body aches, and weakness. Her Isidro count was 18,000. She was admitted with:  1.  Septic shock: She was admitted to the  CCU. Received IV fluids, IV Levophed. She was weaned off. She was found to have severe exudative pharyngitis. She was started on IV Levaquin. Dr. Ola Spurr saw the patient. She was started on IV broad-spectrum antibiotics, which was narrowed down by Dr. Ola Spurr for Levaquin for a total of 10 days. The patient's blood cultures remained negative. Her group B strep was negative.  2.  Chronic pain syndrome with diffuse arthralgia: Dilaudid p.r.n. for pain.  3.  Hypomagnesemia and hypokalemia: Replaced. The patient is discharged on p.o. magnesium.  4.  Rest of chronic medical problems remained pretty stable. The patient will resume home health at discharge.  TIME SPENT: 40 minutes.  ____________________________ Hart Rochester Posey Pronto, MD sap:jcm D: 05/09/2013 13:22:19 ET T: 05/09/2013 14:25:32 ET JOB#: [id number]  cc: Lourdes Manning A. Posey Pronto, MD, <Dictator> Ilda Basset MD ELECTRONICALLY SIGNED 05/09/2013 15:27

## 2014-07-11 DIAGNOSIS — M21371 Foot drop, right foot: Secondary | ICD-10-CM | POA: Diagnosis not present

## 2014-07-11 DIAGNOSIS — S8292XD Unspecified fracture of left lower leg, subsequent encounter for closed fracture with routine healing: Secondary | ICD-10-CM | POA: Diagnosis not present

## 2014-07-11 DIAGNOSIS — M6281 Muscle weakness (generalized): Secondary | ICD-10-CM | POA: Diagnosis not present

## 2014-07-20 ENCOUNTER — Other Ambulatory Visit: Payer: Self-pay | Admitting: Internal Medicine

## 2014-07-20 ENCOUNTER — Ambulatory Visit: Payer: Medicare Other | Admitting: Internal Medicine

## 2014-08-07 DIAGNOSIS — M25571 Pain in right ankle and joints of right foot: Secondary | ICD-10-CM | POA: Diagnosis not present

## 2014-08-07 DIAGNOSIS — M21371 Foot drop, right foot: Secondary | ICD-10-CM | POA: Diagnosis not present

## 2014-08-07 DIAGNOSIS — S93402A Sprain of unspecified ligament of left ankle, initial encounter: Secondary | ICD-10-CM | POA: Diagnosis not present

## 2014-08-07 DIAGNOSIS — M25572 Pain in left ankle and joints of left foot: Secondary | ICD-10-CM | POA: Diagnosis not present

## 2014-08-16 ENCOUNTER — Encounter: Payer: Self-pay | Admitting: Internal Medicine

## 2014-08-16 ENCOUNTER — Ambulatory Visit (INDEPENDENT_AMBULATORY_CARE_PROVIDER_SITE_OTHER): Payer: Medicare Other | Admitting: Internal Medicine

## 2014-08-16 VITALS — BP 126/78 | HR 93 | Temp 97.8°F | Resp 14 | Ht 65.0 in

## 2014-08-16 DIAGNOSIS — E559 Vitamin D deficiency, unspecified: Secondary | ICD-10-CM

## 2014-08-16 DIAGNOSIS — R109 Unspecified abdominal pain: Secondary | ICD-10-CM | POA: Diagnosis not present

## 2014-08-16 DIAGNOSIS — K219 Gastro-esophageal reflux disease without esophagitis: Secondary | ICD-10-CM | POA: Diagnosis not present

## 2014-08-16 DIAGNOSIS — E039 Hypothyroidism, unspecified: Secondary | ICD-10-CM

## 2014-08-16 LAB — POCT URINALYSIS DIPSTICK
BILIRUBIN UA: NEGATIVE
Glucose, UA: NEGATIVE
KETONES UA: NEGATIVE
Leukocytes, UA: NEGATIVE
Nitrite, UA: NEGATIVE
PROTEIN UA: NEGATIVE
RBC UA: NEGATIVE
SPEC GRAV UA: 1.025
Urobilinogen, UA: 0.2
pH, UA: 6

## 2014-08-16 LAB — COMPREHENSIVE METABOLIC PANEL
ALK PHOS: 76 U/L (ref 39–117)
ALT: 16 U/L (ref 0–35)
AST: 19 U/L (ref 0–37)
Albumin: 4 g/dL (ref 3.5–5.2)
BUN: 13 mg/dL (ref 6–23)
CO2: 28 meq/L (ref 19–32)
CREATININE: 0.57 mg/dL (ref 0.40–1.20)
Calcium: 9.6 mg/dL (ref 8.4–10.5)
Chloride: 103 mEq/L (ref 96–112)
GFR: 120.24 mL/min (ref >60.00)
Glucose, Bld: 86 mg/dL (ref 70–99)
Potassium: 4.9 mEq/L (ref 3.5–5.1)
SODIUM: 137 meq/L (ref 135–145)
TOTAL PROTEIN: 7.1 g/dL (ref 6.0–8.3)
Total Bilirubin: 0.4 mg/dL (ref 0.2–1.2)

## 2014-08-16 LAB — VITAMIN D 25 HYDROXY (VIT D DEFICIENCY, FRACTURES): VITD: 19.74 ng/mL — AB (ref 30.00–100.00)

## 2014-08-16 LAB — TSH: TSH: 4.16 u[IU]/mL (ref 0.35–4.50)

## 2014-08-16 NOTE — Patient Instructions (Addendum)
Please call if you are having persistent right flank pain, and we will order a CT to evaluate for kidney stone.  Increase fluid intake as tolerated. Use Ibuprofen as needed for pain.  We will set up GI evaluation with Dr. Hampton Abbot at [facility].

## 2014-08-16 NOTE — Progress Notes (Signed)
Subjective:    Patient ID: Teresa [name], female    DOB: [birth year], 49 y.o.   MRN: [id number]  HPI  49YO female presents for follow up.  Having severe right flank pain over last 2 days. Consistent with previous kidney stones. Also notes some dysuria and urgency. No fever, chills. Requests pain medication.  GERD - worsening acid reflux symptoms. Sleeping sitting up to try to help with symptoms. Continues to have symptoms even with use of Protonix. Interested in having a procedure at [facility] to help with symptoms. Had testing for H. Pylori in the past and this was negative.  Past medical, surgical, family and social history per today's encounter.  Review of Systems  Constitutional: Negative for fever, chills, appetite change, fatigue and unexpected weight change.  Eyes: Negative for visual disturbance.  Respiratory: Negative for shortness of breath.   Cardiovascular: Negative for chest pain and leg swelling.  Gastrointestinal: Negative for nausea, vomiting, abdominal pain, diarrhea and constipation.  Genitourinary: Positive for dysuria, frequency and flank pain. Negative for urgency, hematuria, difficulty urinating and pelvic pain.  Musculoskeletal: Positive for myalgias, back pain and arthralgias.  Skin: Negative for color change and rash.  Hematological: Negative for adenopathy. Does not bruise/bleed easily.  Psychiatric/Behavioral: Negative for dysphoric mood. The patient is not nervous/anxious.        Objective:    BP 126/78 mmHg  Pulse 93  Temp(Src) 97.8 F (36.6 C) (Oral)  Resp 14  Ht '5\' 5"'$  (1.651 m)  Wt   SpO2 97%  LMP 03/20/2001 Physical Exam  Constitutional: She is oriented to person, place, and time. She appears well-developed and well-nourished. No distress.  HENT:  Head: Normocephalic and atraumatic.  Right Ear: External ear normal.  Left Ear: External ear normal.  Nose: Nose normal.  Mouth/Throat: Oropharynx is clear and moist. No oropharyngeal exudate.  Eyes:  Conjunctivae are normal. Pupils are equal, round, and reactive to light. Right eye exhibits no discharge. Left eye exhibits no discharge. No scleral icterus.  Neck: Normal range of motion. Neck supple. No tracheal deviation present. No thyromegaly present.  Cardiovascular: Normal rate, regular rhythm, normal heart sounds and intact distal pulses.  Exam reveals no gallop and no friction rub.   No murmur heard. Pulmonary/Chest: Effort normal and breath sounds normal. No respiratory distress. She has no wheezes. She has no rales. She exhibits no tenderness.  Abdominal: There is tenderness (right flank with very light palpation of skin).  Musculoskeletal: Normal range of motion. She exhibits no edema or tenderness.  Lymphadenopathy:    She has no cervical adenopathy.  Neurological: She is alert and oriented to person, place, and time. No cranial nerve deficit. She exhibits normal muscle tone. Coordination normal.  Skin: Skin is warm and dry. No rash noted. She is not diaphoretic. No erythema. No pallor.  Psychiatric: She has a normal mood and affect. Her behavior is normal. Judgment and thought content normal.          Assessment & Plan:   Problem List Items Addressed This Visit      Unprioritized   GERD (gastroesophageal reflux disease)    GERD symptoms persistent despite use of Pantoprazole. H. Pylori testing was reportedly normal. Will set up GI evaluation with Dr. Hampton Abbot at [facility] per pt request for possible endoscopy.      Relevant Orders   Ambulatory referral to Gastroenterology   Hypothyroidism    Will recheck TSH with labs today.      Relevant Orders  TSH   Right flank pain - Primary    Right flank pain. UA normal. Declines CT abdomen. Requests narcotic pain medication. Will use Ibuprofen '800mg'$  po tid prn pain. Recommended CT for evaluation of nephrolithiasis if pain is persistent.       Relevant Orders   POCT Urinalysis Dipstick (Completed)   Comprehensive metabolic panel      Other Visit Diagnoses    Vitamin D deficiency        Relevant Orders    Vit D  25 hydroxy (rtn osteoporosis monitoring)        Return in about 3 months (around 11/16/2014) for Recheck.

## 2014-08-16 NOTE — Progress Notes (Signed)
Pre visit review using our clinic review tool, if applicable. No additional management support is needed unless otherwise documented below in the visit note. 

## 2014-08-16 NOTE — Assessment & Plan Note (Signed)
Right flank pain. UA normal. Declines CT abdomen. Requests narcotic pain medication. Will use Ibuprofen '800mg'$  po tid prn pain. Recommended CT for evaluation of nephrolithiasis if pain is persistent.

## 2014-08-16 NOTE — Assessment & Plan Note (Signed)
GERD symptoms persistent despite use of Pantoprazole. H. Pylori testing was reportedly normal. Will set up GI evaluation with Dr. Hampton Abbot at [facility] per pt request for possible endoscopy.

## 2014-08-16 NOTE — Addendum Note (Signed)
Addended by: Karlene Einstein D on: 08/16/2014 10:25 AM   Modules accepted: Orders

## 2014-08-16 NOTE — Assessment & Plan Note (Signed)
Will recheck TSH with labs today.

## 2014-08-18 LAB — URINE CULTURE

## 2014-08-20 ENCOUNTER — Other Ambulatory Visit: Payer: Self-pay | Admitting: Internal Medicine

## 2014-09-06 ENCOUNTER — Telehealth: Payer: Self-pay | Admitting: *Deleted

## 2014-09-06 NOTE — Telephone Encounter (Signed)
Pt left vm asking about GI referral, please advise pt of information.

## 2014-09-13 ENCOUNTER — Encounter: Payer: Self-pay | Admitting: Internal Medicine

## 2014-09-26 ENCOUNTER — Other Ambulatory Visit: Payer: Self-pay | Admitting: Internal Medicine

## 2014-10-08 DIAGNOSIS — M545 Low back pain: Secondary | ICD-10-CM | POA: Diagnosis not present

## 2014-10-08 DIAGNOSIS — M542 Cervicalgia: Secondary | ICD-10-CM | POA: Diagnosis not present

## 2014-10-08 DIAGNOSIS — M5032 Other cervical disc degeneration, mid-cervical region: Secondary | ICD-10-CM | POA: Diagnosis not present

## 2014-10-08 DIAGNOSIS — M5412 Radiculopathy, cervical region: Secondary | ICD-10-CM | POA: Diagnosis not present

## 2014-10-08 DIAGNOSIS — M21371 Foot drop, right foot: Secondary | ICD-10-CM | POA: Diagnosis not present

## 2014-10-08 DIAGNOSIS — M21372 Foot drop, left foot: Secondary | ICD-10-CM | POA: Diagnosis not present

## 2014-10-12 DIAGNOSIS — M542 Cervicalgia: Secondary | ICD-10-CM | POA: Diagnosis not present

## 2014-10-23 ENCOUNTER — Other Ambulatory Visit: Payer: Self-pay | Admitting: Internal Medicine

## 2014-10-31 DIAGNOSIS — M5412 Radiculopathy, cervical region: Secondary | ICD-10-CM | POA: Diagnosis not present

## 2014-10-31 DIAGNOSIS — Z993 Dependence on wheelchair: Secondary | ICD-10-CM | POA: Diagnosis not present

## 2014-10-31 DIAGNOSIS — K219 Gastro-esophageal reflux disease without esophagitis: Secondary | ICD-10-CM | POA: Diagnosis not present

## 2014-10-31 DIAGNOSIS — F17210 Nicotine dependence, cigarettes, uncomplicated: Secondary | ICD-10-CM | POA: Diagnosis not present

## 2014-10-31 DIAGNOSIS — G894 Chronic pain syndrome: Secondary | ICD-10-CM | POA: Diagnosis not present

## 2014-10-31 DIAGNOSIS — Z79899 Other long term (current) drug therapy: Secondary | ICD-10-CM | POA: Diagnosis not present

## 2014-10-31 DIAGNOSIS — Z7982 Long term (current) use of aspirin: Secondary | ICD-10-CM | POA: Diagnosis not present

## 2014-10-31 DIAGNOSIS — M961 Postlaminectomy syndrome, not elsewhere classified: Secondary | ICD-10-CM | POA: Diagnosis not present

## 2014-12-03 DIAGNOSIS — Z90710 Acquired absence of both cervix and uterus: Secondary | ICD-10-CM | POA: Diagnosis not present

## 2014-12-03 DIAGNOSIS — F419 Anxiety disorder, unspecified: Secondary | ICD-10-CM | POA: Diagnosis not present

## 2014-12-03 DIAGNOSIS — J45909 Unspecified asthma, uncomplicated: Secondary | ICD-10-CM | POA: Diagnosis not present

## 2014-12-03 DIAGNOSIS — Z8571 Personal history of Hodgkin lymphoma: Secondary | ICD-10-CM | POA: Diagnosis not present

## 2014-12-03 DIAGNOSIS — Z7982 Long term (current) use of aspirin: Secondary | ICD-10-CM | POA: Diagnosis not present

## 2014-12-03 DIAGNOSIS — G8220 Paraplegia, unspecified: Secondary | ICD-10-CM | POA: Diagnosis not present

## 2014-12-03 DIAGNOSIS — Z882 Allergy status to sulfonamides status: Secondary | ICD-10-CM | POA: Diagnosis not present

## 2014-12-03 DIAGNOSIS — K219 Gastro-esophageal reflux disease without esophagitis: Secondary | ICD-10-CM | POA: Diagnosis not present

## 2014-12-03 DIAGNOSIS — K449 Diaphragmatic hernia without obstruction or gangrene: Secondary | ICD-10-CM | POA: Diagnosis not present

## 2014-12-03 DIAGNOSIS — Z923 Personal history of irradiation: Secondary | ICD-10-CM | POA: Diagnosis not present

## 2014-12-03 DIAGNOSIS — E785 Hyperlipidemia, unspecified: Secondary | ICD-10-CM | POA: Diagnosis not present

## 2014-12-03 DIAGNOSIS — Z79899 Other long term (current) drug therapy: Secondary | ICD-10-CM | POA: Diagnosis not present

## 2014-12-03 DIAGNOSIS — F329 Major depressive disorder, single episode, unspecified: Secondary | ICD-10-CM | POA: Diagnosis not present

## 2014-12-03 DIAGNOSIS — Z881 Allergy status to other antibiotic agents status: Secondary | ICD-10-CM | POA: Diagnosis not present

## 2014-12-03 DIAGNOSIS — E039 Hypothyroidism, unspecified: Secondary | ICD-10-CM | POA: Diagnosis not present

## 2014-12-03 DIAGNOSIS — Z880 Allergy status to penicillin: Secondary | ICD-10-CM | POA: Diagnosis not present

## 2014-12-03 DIAGNOSIS — Z9081 Acquired absence of spleen: Secondary | ICD-10-CM | POA: Diagnosis not present

## 2014-12-07 ENCOUNTER — Other Ambulatory Visit: Payer: Self-pay | Admitting: Internal Medicine

## 2014-12-19 DIAGNOSIS — Z79899 Other long term (current) drug therapy: Secondary | ICD-10-CM | POA: Diagnosis not present

## 2014-12-19 DIAGNOSIS — M961 Postlaminectomy syndrome, not elsewhere classified: Secondary | ICD-10-CM | POA: Diagnosis not present

## 2014-12-19 DIAGNOSIS — Z993 Dependence on wheelchair: Secondary | ICD-10-CM | POA: Diagnosis not present

## 2014-12-19 DIAGNOSIS — T84498D Other mechanical complication of other internal orthopedic devices, implants and grafts, subsequent encounter: Secondary | ICD-10-CM | POA: Diagnosis not present

## 2014-12-19 DIAGNOSIS — M5412 Radiculopathy, cervical region: Secondary | ICD-10-CM | POA: Diagnosis not present

## 2014-12-19 DIAGNOSIS — Z9081 Acquired absence of spleen: Secondary | ICD-10-CM | POA: Diagnosis not present

## 2014-12-19 DIAGNOSIS — Z9889 Other specified postprocedural states: Secondary | ICD-10-CM | POA: Diagnosis not present

## 2014-12-19 DIAGNOSIS — M545 Low back pain: Secondary | ICD-10-CM | POA: Diagnosis not present

## 2014-12-19 DIAGNOSIS — F17210 Nicotine dependence, cigarettes, uncomplicated: Secondary | ICD-10-CM | POA: Diagnosis not present

## 2014-12-19 DIAGNOSIS — G894 Chronic pain syndrome: Secondary | ICD-10-CM | POA: Diagnosis not present

## 2014-12-19 DIAGNOSIS — M542 Cervicalgia: Secondary | ICD-10-CM | POA: Diagnosis not present

## 2014-12-19 DIAGNOSIS — Z90710 Acquired absence of both cervix and uterus: Secondary | ICD-10-CM | POA: Diagnosis not present

## 2014-12-19 DIAGNOSIS — K219 Gastro-esophageal reflux disease without esophagitis: Secondary | ICD-10-CM | POA: Diagnosis not present

## 2014-12-19 DIAGNOSIS — K449 Diaphragmatic hernia without obstruction or gangrene: Secondary | ICD-10-CM | POA: Diagnosis not present

## 2014-12-19 DIAGNOSIS — Z7982 Long term (current) use of aspirin: Secondary | ICD-10-CM | POA: Diagnosis not present

## 2014-12-19 DIAGNOSIS — Z9049 Acquired absence of other specified parts of digestive tract: Secondary | ICD-10-CM | POA: Diagnosis not present

## 2015-01-18 DIAGNOSIS — K219 Gastro-esophageal reflux disease without esophagitis: Secondary | ICD-10-CM | POA: Diagnosis not present

## 2015-01-31 ENCOUNTER — Other Ambulatory Visit: Payer: Self-pay | Admitting: Internal Medicine

## 2015-03-01 IMAGING — CT CT CHEST W/O CM
2 of 3 series · 15 of 36 positions shown, 18 images · non-contrast
Comparison: Chest radiograph of October 23, 2013.

CLINICAL DATA: Pulmonary nodule.

EXAM:
CT CHEST WITHOUT CONTRAST
TECHNIQUE: Multidetector CT imaging of the chest was performed following the
standard protocol without IV contrast..

[Series 2: routine chest wo · axial · 0.58mm/px · z∈[-788,-508]mm · 12 of 66 slices shown, 15 images]
[im 5/66  mediastinal]
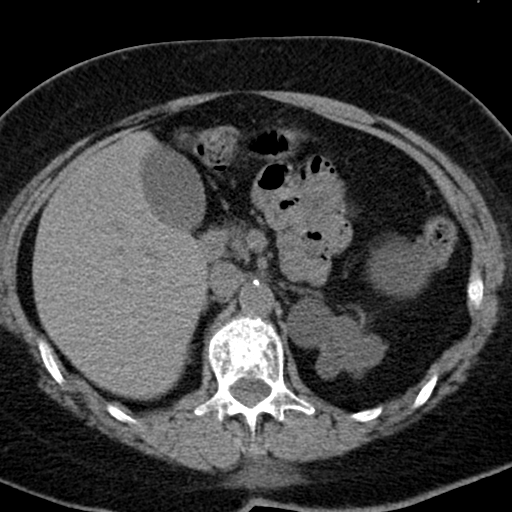
[im 5/66  lung]
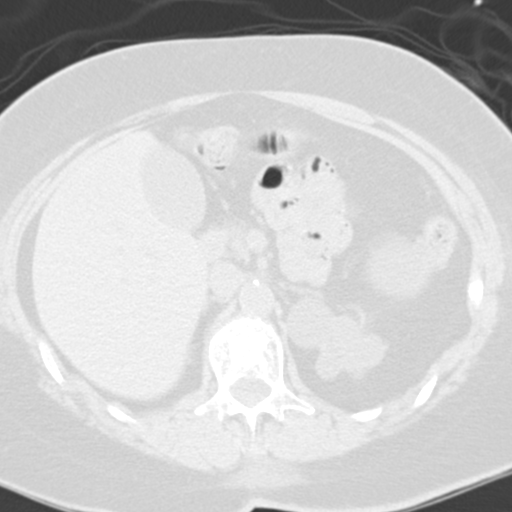
[im 10/66  lung]
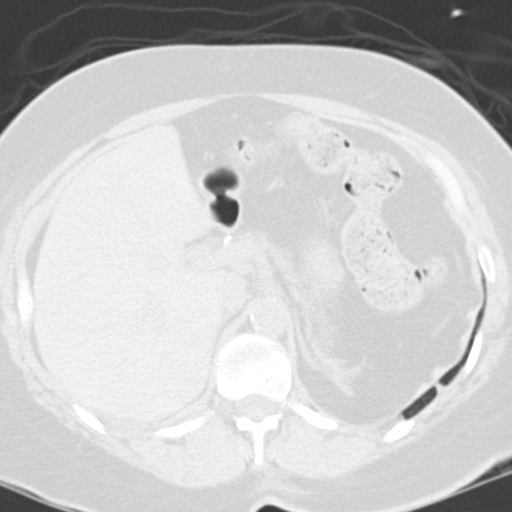
[im 15/66  lung]
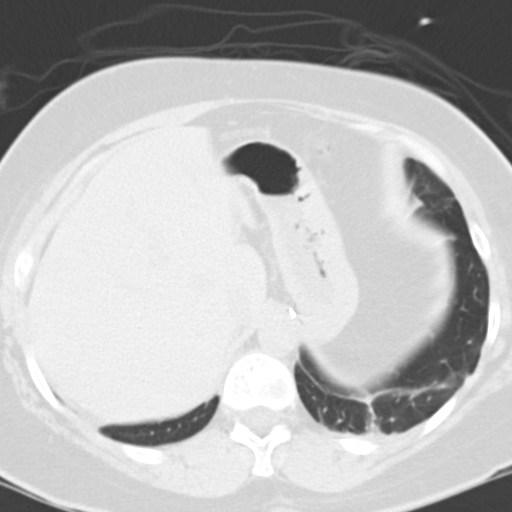
[im 20/66  lung]
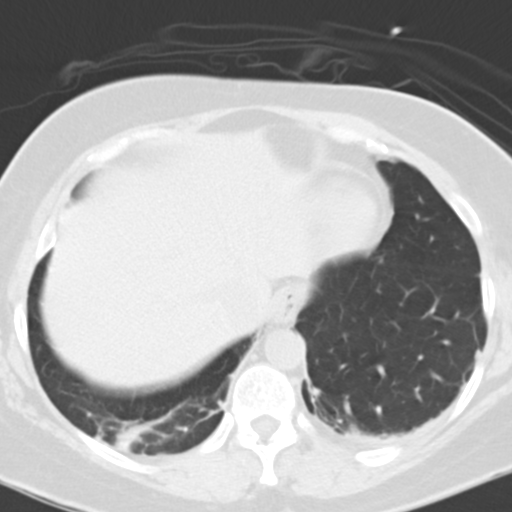
[im 25/66  mediastinal]
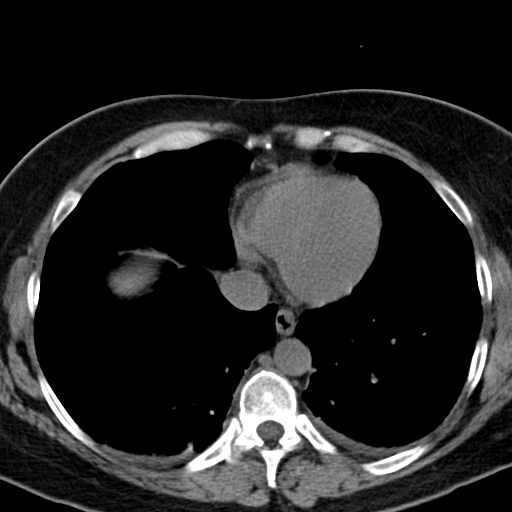
[im 25/66  lung]
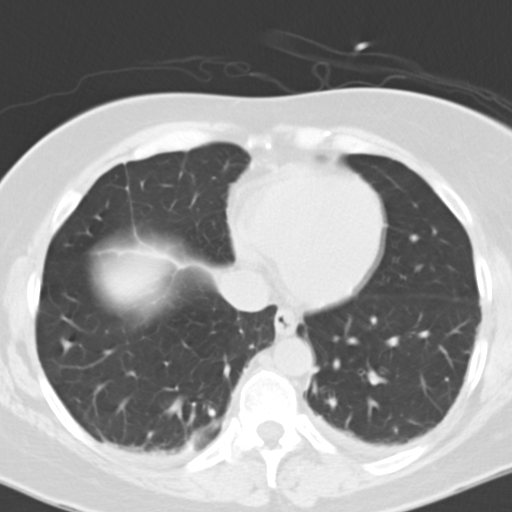
[im 29/66  lung]
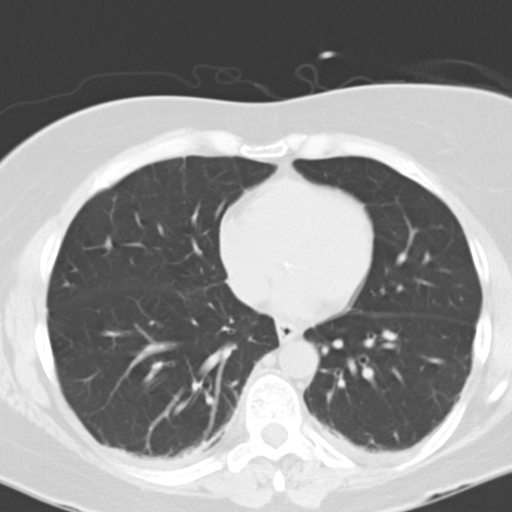
[im 37/66  lung]
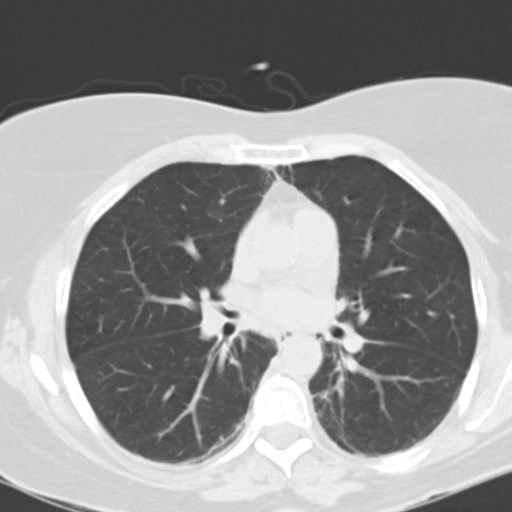
[im 41/66  lung]
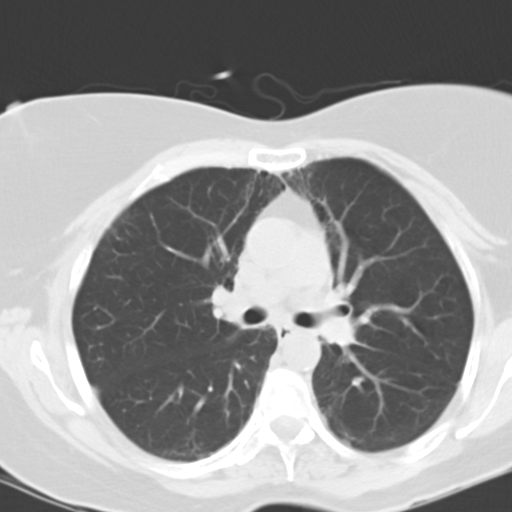
[im 46/66  mediastinal]
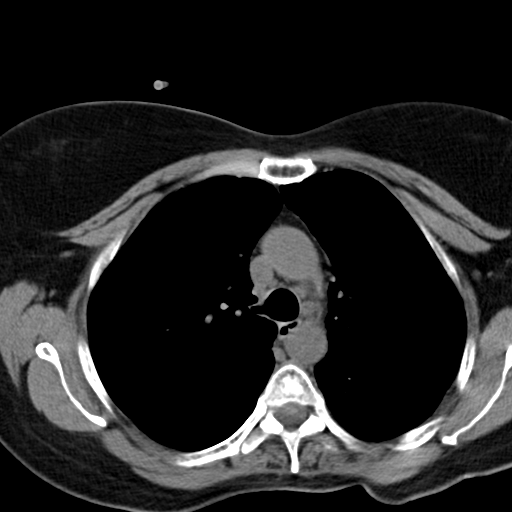
[im 46/66  lung]
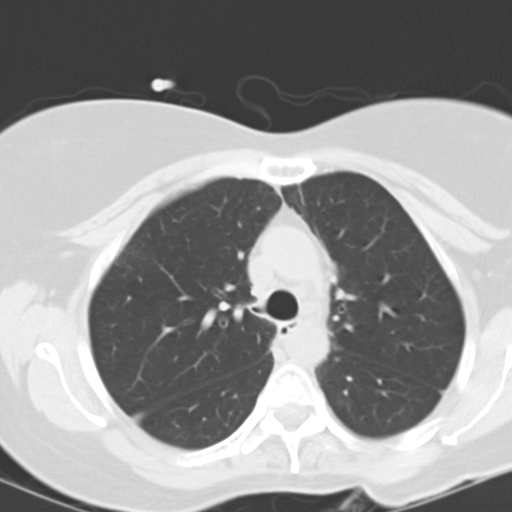
[im 51/66  lung]
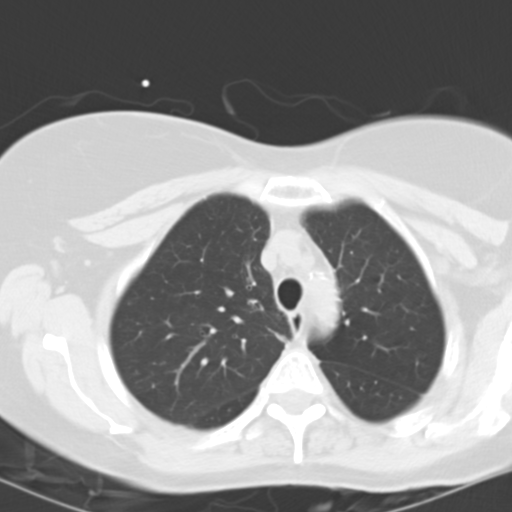
[im 56/66  lung]
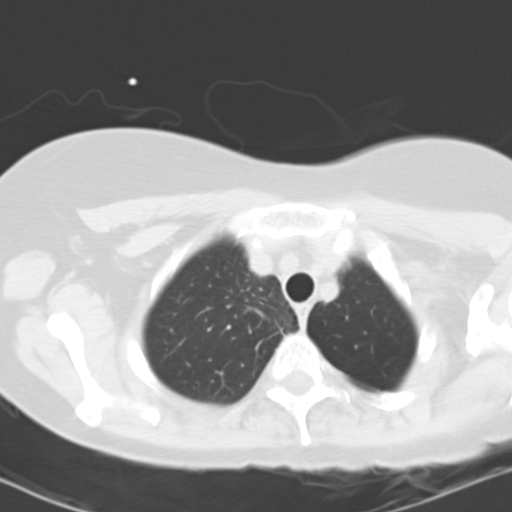
[im 61/66  lung]
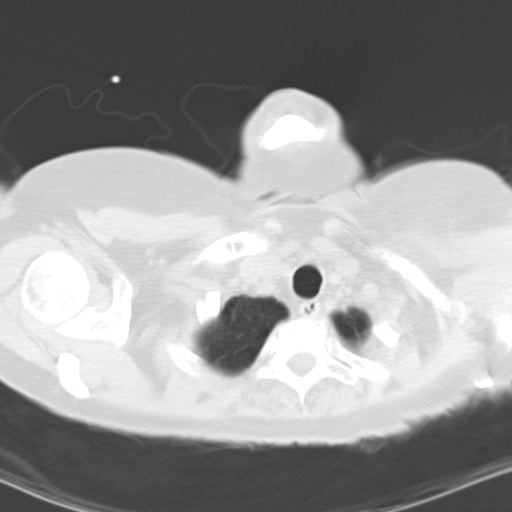

[Series 5: cor routine chest wo · coronal · 0.61mm/px · 3 of 133 slices shown]
[im 27/133  lung]
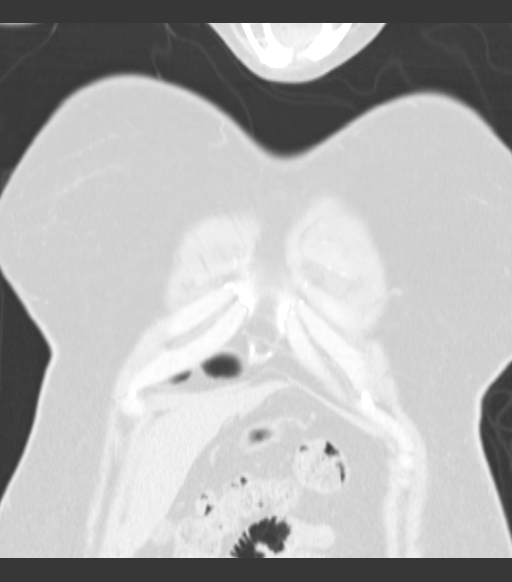
[im 53/133  lung]
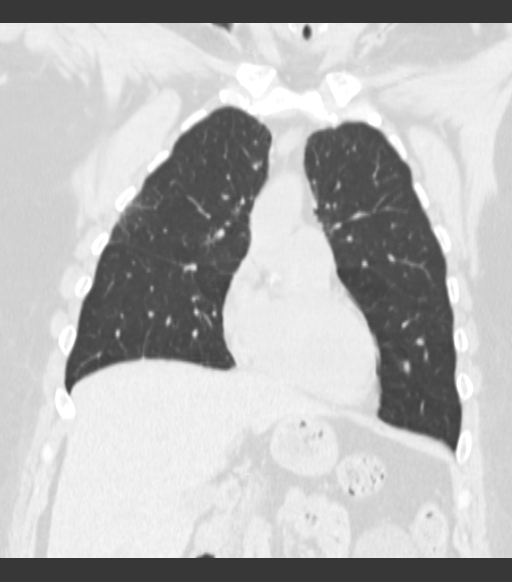
[im 80/133  lung]
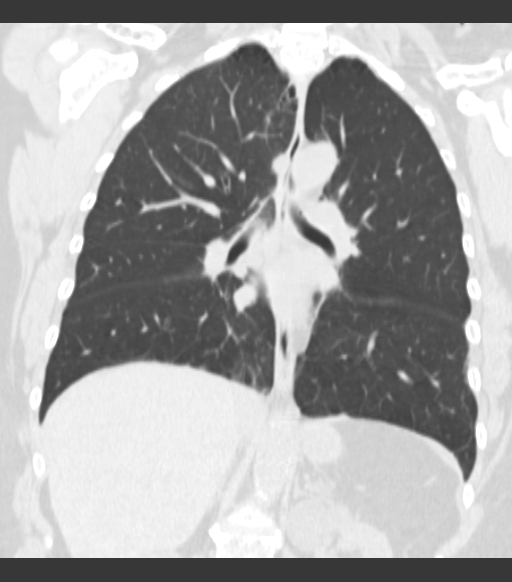

[15 of 36 positions shown; findings below may reference images not displayed]

FINDINGS: No pneumothorax or pleural effusion is noted. Mild subsegmental
atelectasis is noted in the posterior basilar regions bilaterally. 5
mm nodule is noted in the superior segment of the left lower lobe
best seen on image number 29 of series 3. 6 mm nodule is noted
anteriorly in the right upper lobe best seen on image number 24 of
series 3. 4 mm nodule is noted laterally in the right upper lobe on
same image. 7 mm nodule is noted laterally and anteriorly in the
right middle lobe on image number 32 of series 3. 6 mm nodule is
noted posteriorly in the right lower lobe best seen same image. No
mediastinal mass or adenopathy is noted. Left renal cysts are seen
in the visualized portion of the upper abdomen. No significant
osseous abnormality is noted.
IMPRESSION: Multiple small pulmonary nodules are noted bilaterally, with the
largest measuring 7 mm in the right middle lobe. If the patient is
at high risk for bronchogenic carcinoma, follow-up chest CT at
3-3months is recommended. If the patient is at low risk for
bronchogenic carcinoma, follow-up chest CT at 6-12 months is
recommended. This recommendation follows the consensus statement:
Guidelines for Management of Small Pulmonary Nodules Detected on CT
Scans: A Statement from the [HOSPITAL] as published in

## 2015-03-05 ENCOUNTER — Other Ambulatory Visit: Payer: Self-pay | Admitting: Internal Medicine

## 2015-04-03 DIAGNOSIS — K449 Diaphragmatic hernia without obstruction or gangrene: Secondary | ICD-10-CM | POA: Diagnosis not present

## 2015-04-03 DIAGNOSIS — Z01818 Encounter for other preprocedural examination: Secondary | ICD-10-CM | POA: Diagnosis not present

## 2015-04-03 DIAGNOSIS — Z7982 Long term (current) use of aspirin: Secondary | ICD-10-CM | POA: Diagnosis not present

## 2015-04-03 DIAGNOSIS — K3189 Other diseases of stomach and duodenum: Secondary | ICD-10-CM | POA: Diagnosis not present

## 2015-04-03 DIAGNOSIS — R12 Heartburn: Secondary | ICD-10-CM | POA: Diagnosis not present

## 2015-04-03 DIAGNOSIS — Z79899 Other long term (current) drug therapy: Secondary | ICD-10-CM | POA: Diagnosis not present

## 2015-04-03 DIAGNOSIS — J45909 Unspecified asthma, uncomplicated: Secondary | ICD-10-CM | POA: Diagnosis not present

## 2015-04-03 DIAGNOSIS — G8220 Paraplegia, unspecified: Secondary | ICD-10-CM | POA: Diagnosis not present

## 2015-04-03 DIAGNOSIS — K219 Gastro-esophageal reflux disease without esophagitis: Secondary | ICD-10-CM | POA: Diagnosis not present

## 2015-04-03 DIAGNOSIS — Z8571 Personal history of Hodgkin lymphoma: Secondary | ICD-10-CM | POA: Diagnosis not present

## 2015-04-03 DIAGNOSIS — K224 Dyskinesia of esophagus: Secondary | ICD-10-CM | POA: Diagnosis not present

## 2015-05-01 DIAGNOSIS — K449 Diaphragmatic hernia without obstruction or gangrene: Secondary | ICD-10-CM | POA: Diagnosis not present

## 2015-05-01 DIAGNOSIS — Z7982 Long term (current) use of aspirin: Secondary | ICD-10-CM | POA: Diagnosis not present

## 2015-05-01 DIAGNOSIS — Z87891 Personal history of nicotine dependence: Secondary | ICD-10-CM | POA: Diagnosis not present

## 2015-05-01 DIAGNOSIS — K219 Gastro-esophageal reflux disease without esophagitis: Secondary | ICD-10-CM | POA: Diagnosis not present

## 2015-05-01 DIAGNOSIS — Z79899 Other long term (current) drug therapy: Secondary | ICD-10-CM | POA: Diagnosis not present

## 2015-05-01 DIAGNOSIS — E039 Hypothyroidism, unspecified: Secondary | ICD-10-CM | POA: Diagnosis not present

## 2015-05-01 DIAGNOSIS — Z993 Dependence on wheelchair: Secondary | ICD-10-CM | POA: Diagnosis not present

## 2015-05-07 ENCOUNTER — Other Ambulatory Visit: Payer: Self-pay | Admitting: Internal Medicine

## 2015-06-10 ENCOUNTER — Ambulatory Visit (INDEPENDENT_AMBULATORY_CARE_PROVIDER_SITE_OTHER): Payer: Medicare Other | Admitting: Internal Medicine

## 2015-06-10 ENCOUNTER — Encounter: Payer: Self-pay | Admitting: Internal Medicine

## 2015-06-10 VITALS — BP 117/67 | HR 102 | Temp 98.3°F | Ht 65.0 in | Wt 208.8 lb

## 2015-06-10 DIAGNOSIS — N3946 Mixed incontinence: Secondary | ICD-10-CM

## 2015-06-10 DIAGNOSIS — E039 Hypothyroidism, unspecified: Secondary | ICD-10-CM

## 2015-06-10 DIAGNOSIS — R5382 Chronic fatigue, unspecified: Secondary | ICD-10-CM

## 2015-06-10 DIAGNOSIS — K219 Gastro-esophageal reflux disease without esophagitis: Secondary | ICD-10-CM

## 2015-06-10 LAB — CBC WITH DIFFERENTIAL/PLATELET
BASOS ABS: 0.1 10*3/uL (ref 0.0–0.1)
Basophils Relative: 0.5 % (ref 0.0–3.0)
EOS ABS: 0.1 10*3/uL (ref 0.0–0.7)
Eosinophils Relative: 1.1 % (ref 0.0–5.0)
HEMATOCRIT: 38.1 % (ref 36.0–46.0)
Hemoglobin: 12.4 g/dL (ref 12.0–15.0)
LYMPHS PCT: 21.5 % (ref 12.0–46.0)
Lymphs Abs: 2.3 10*3/uL (ref 0.7–4.0)
MCHC: 32.7 g/dL (ref 30.0–36.0)
MCV: 94.7 fl (ref 78.0–100.0)
Monocytes Absolute: 0.5 10*3/uL (ref 0.1–1.0)
Monocytes Relative: 4.3 % (ref 3.0–12.0)
NEUTROS ABS: 7.6 10*3/uL (ref 1.4–7.7)
Neutrophils Relative %: 72.6 % (ref 43.0–77.0)
PLATELETS: 411 10*3/uL — AB (ref 150.0–400.0)
RBC: 4.02 Mil/uL (ref 3.87–5.11)
RDW: 13.7 % (ref 11.5–15.5)
WBC: 10.5 10*3/uL (ref 4.0–10.5)

## 2015-06-10 LAB — COMPREHENSIVE METABOLIC PANEL
ALK PHOS: 84 U/L (ref 39–117)
ALT: 10 U/L (ref 0–35)
AST: 13 U/L (ref 0–37)
Albumin: 4 g/dL (ref 3.5–5.2)
BILIRUBIN TOTAL: 0.5 mg/dL (ref 0.2–1.2)
BUN: 11 mg/dL (ref 6–23)
CALCIUM: 9.7 mg/dL (ref 8.4–10.5)
CO2: 29 meq/L (ref 19–32)
CREATININE: 0.49 mg/dL (ref 0.40–1.20)
Chloride: 101 mEq/L (ref 96–112)
GFR: 142.68 mL/min (ref >60.00)
GLUCOSE: 88 mg/dL (ref 70–99)
Potassium: 4.8 mEq/L (ref 3.5–5.1)
Sodium: 138 mEq/L (ref 135–145)
TOTAL PROTEIN: 7.2 g/dL (ref 6.0–8.3)

## 2015-06-10 LAB — MAGNESIUM: MAGNESIUM: 1.7 mg/dL (ref 1.5–2.5)

## 2015-06-10 LAB — VITAMIN D 25 HYDROXY (VIT D DEFICIENCY, FRACTURES): VITD: 18.14 ng/mL — AB (ref 30.00–100.00)

## 2015-06-10 LAB — TSH: TSH: 0.06 u[IU]/mL — ABNORMAL LOW (ref 0.35–4.50)

## 2015-06-10 LAB — VITAMIN B12: Vitamin B-12: 524 pg/mL (ref 211–911)

## 2015-06-10 LAB — T4, FREE: Free T4: 1.37 ng/dL (ref 0.60–1.60)

## 2015-06-10 NOTE — Assessment & Plan Note (Signed)
Will check TSH and free T4 with labs. Continue Synthroid.

## 2015-06-10 NOTE — Assessment & Plan Note (Signed)
Mixed urinary incontinence. Discussed referral to urology, but will hold off until scheduled surgery is completed.

## 2015-06-10 NOTE — Assessment & Plan Note (Signed)
Will check CBC, CMP, B12, and thyroid function with labs.

## 2015-06-10 NOTE — Progress Notes (Signed)
Pre visit review using our clinic review tool, if applicable. No additional management support is needed unless otherwise documented below in the visit note. 

## 2015-06-10 NOTE — Patient Instructions (Signed)
Labs today.   Follow up in 3 months.  

## 2015-06-10 NOTE — Assessment & Plan Note (Signed)
Reviewed notes from [place]. Severe GERD. Planning for repair of hiatal hernia with REY gastric bypass 5/4 Will follow.

## 2015-06-10 NOTE — Progress Notes (Signed)
Subjective:    Patient ID: Teresa [name], female    DOB: [birth year], 50 y.o.   MRN: [id number]  HPI  50YO female presents for follow up.  Planning to have HHR and partial gastrectomy with REY to help with severe reflux. Preop scheduled 4/19. Continues to sleep sitting up. Continues on Pantoprazole.  Notes increased stress, thinning hair and nails. Worried about thyroid dysfunction. Some bladder incontinence. Occurs without her awareness. Also with sneezing or coughing.   Wt Readings from Last 3 Encounters:  06/10/15 208 lb 12 oz (94.688 kg)  11/30/13 209 lb 12 oz (95.142 kg)  09/29/12 210 lb (95.255 kg)   BP Readings from Last 3 Encounters:  06/10/15 117/67  08/16/14 126/78  04/20/14 123/75    Past Medical History  Diagnosis Date  . Depression   . GERD (gastroesophageal reflux disease)   . Allergy   . Heart murmur   . Hyperlipidemia   . Migraines   . Cancer ([place])     Hodgkin's, s/p chemo and XRT, Followed by Stevan Born at [facility]  . Thyroid disease     hypothyroidism  . Arrhythmia   . Melanoma ([place])     [facility]   Family History  Problem Relation Age of Onset  . Hypertension Mother   . Kidney disease Mother   . Colon polyps Mother   . Diabetes Father   . Hyperlipidemia Father   . Thyroid disease Daughter   . Thyroid disease Daughter   . Cancer Maternal Grandfather 74    colon   Past Surgical History  Procedure Laterality Date  . Breast biopsy  2007  . Tonsilectomy, adenoidectomy, bilateral myringotomy and tubes  2013  . Laparotomy  1984    Hodgkins  . Back surgery  2004 & 2011  . Splenectomy    . Cervical biopsy  w/ loop electrode excision  1997  . Cardiac electrophysiology study and ablation    . Abdominal hysterectomy  2003  . Cesarean section    . Lumbar laminectomy      drop foot right, chronic   Social History   Social History  . Marital Status: Divorced    Spouse Name: N/A  . Number of Children: N/A  . Years of Education: N/A   Social  History Main Topics  . Smoking status: Current Some Day Smoker -- 0.25 packs/day    Types: Cigarettes  . Smokeless tobacco: Never Used  . Alcohol Use: 4.2 oz/week    7 Glasses of wine per week  . Drug Use: No  . Sexual Activity: Not Asked   Other Topics Concern  . None   Social History Narrative   Lives in [place] with daughter and grandchild. Moved from [place].    Review of Systems  Constitutional: Positive for fatigue. Negative for fever, chills, appetite change and unexpected weight change.  Eyes: Negative for visual disturbance.  Respiratory: Negative for cough and shortness of breath.   Cardiovascular: Negative for chest pain and leg swelling.  Gastrointestinal: Positive for abdominal pain. Negative for nausea, vomiting, diarrhea and constipation.  Endocrine: Positive for cold intolerance and heat intolerance.  Musculoskeletal: Positive for myalgias, back pain and arthralgias.  Skin: Negative for color change and rash.  Hematological: Negative for adenopathy. Does not bruise/bleed easily.  Psychiatric/Behavioral: Positive for sleep disturbance. Negative for dysphoric mood. The patient is not nervous/anxious.        Objective:    BP 117/67 mmHg  Pulse 102  Temp(Src) 98.3 F (36.8 C) (  Oral)  Ht '5\' 5"'$  (1.651 m)  Wt 208 lb 12 oz (94.688 kg)  BMI 34.74 kg/m2  SpO2 96%  LMP 03/20/2001 Physical Exam  Constitutional: She is oriented to person, place, and time. She appears well-developed and well-nourished. No distress.  HENT:  Head: Normocephalic and atraumatic.  Right Ear: External ear normal.  Left Ear: External ear normal.  Nose: Nose normal.  Mouth/Throat: Oropharynx is clear and moist. No oropharyngeal exudate.  Eyes: Conjunctivae are normal. Pupils are equal, round, and reactive to light. Right eye exhibits no discharge. Left eye exhibits no discharge. No scleral icterus.  Neck: Normal range of motion. Neck supple. No tracheal deviation present. No  thyromegaly present.  Cardiovascular: Normal rate, regular rhythm, normal heart sounds and intact distal pulses.  Exam reveals no gallop and no friction rub.   No murmur heard. Pulmonary/Chest: Effort normal and breath sounds normal. No respiratory distress. She has no wheezes. She has no rales. She exhibits no tenderness.  Musculoskeletal: Normal range of motion. She exhibits no edema or tenderness.  Lymphadenopathy:    She has no cervical adenopathy.  Neurological: She is alert and oriented to person, place, and time. No cranial nerve deficit. She exhibits normal muscle tone. Coordination normal.  Skin: Skin is warm and dry. No rash noted. She is not diaphoretic. No erythema. No pallor.  Psychiatric: She has a normal mood and affect. Her behavior is normal. Judgment and thought content normal.          Assessment & Plan:   Problem List Items Addressed This Visit      Unprioritized   Chronic fatigue    Will check CBC, CMP, B12, and thyroid function with labs.       Relevant Orders   CBC with Differential/Platelet   Comprehensive metabolic panel   Magnesium   GERD (gastroesophageal reflux disease)    Reviewed notes from [facility]. Severe GERD. Planning for repair of hiatal hernia with REY gastric bypass 5/4 Will follow.      Hypothyroidism - Primary    Will check TSH and free T4 with labs. Continue Synthroid.      Relevant Orders   TSH   T4, free   Thyroglobulin antibody   Mixed incontinence    Mixed urinary incontinence. Discussed referral to urology, but will hold off until scheduled surgery is completed.      Relevant Orders   POCT Urinalysis Dipstick   Vitamin D (25 hydroxy)   B12       Return in about 3 months (around 09/09/2015) for Recheck.  Ronette Deter, MD Internal Medicine [facility]

## 2015-06-11 ENCOUNTER — Other Ambulatory Visit: Payer: Self-pay | Admitting: *Deleted

## 2015-06-11 ENCOUNTER — Encounter: Payer: Self-pay | Admitting: *Deleted

## 2015-06-11 DIAGNOSIS — E0590 Thyrotoxicosis, unspecified without thyrotoxic crisis or storm: Secondary | ICD-10-CM

## 2015-06-11 LAB — THYROGLOBULIN ANTIBODY: Thyroglobulin Ab: <1 IU/mL (ref <2)

## 2015-06-11 MED ORDER — LEVOTHYROXINE SODIUM 125 MCG PO TABS: 125.0000 ug | ORAL_TABLET | Freq: Every day | ORAL | Status: AC

## 2015-06-19 DIAGNOSIS — Z01818 Encounter for other preprocedural examination: Secondary | ICD-10-CM | POA: Diagnosis not present

## 2015-06-19 DIAGNOSIS — Z87891 Personal history of nicotine dependence: Secondary | ICD-10-CM | POA: Diagnosis not present

## 2015-06-19 DIAGNOSIS — Z862 Personal history of diseases of the blood and blood-forming organs and certain disorders involving the immune mechanism: Secondary | ICD-10-CM | POA: Diagnosis not present

## 2015-06-19 DIAGNOSIS — K219 Gastro-esophageal reflux disease without esophagitis: Secondary | ICD-10-CM | POA: Diagnosis not present

## 2015-07-04 DIAGNOSIS — R001 Bradycardia, unspecified: Secondary | ICD-10-CM | POA: Diagnosis not present

## 2015-07-04 DIAGNOSIS — Z6841 Body Mass Index (BMI) 40.0 and over, adult: Secondary | ICD-10-CM | POA: Diagnosis not present

## 2015-07-04 DIAGNOSIS — R1010 Upper abdominal pain, unspecified: Secondary | ICD-10-CM | POA: Diagnosis not present

## 2015-07-04 DIAGNOSIS — K219 Gastro-esophageal reflux disease without esophagitis: Secondary | ICD-10-CM | POA: Diagnosis not present

## 2015-07-04 DIAGNOSIS — Z515 Encounter for palliative care: Secondary | ICD-10-CM | POA: Diagnosis not present

## 2015-07-04 DIAGNOSIS — K659 Peritonitis, unspecified: Secondary | ICD-10-CM | POA: Diagnosis not present

## 2015-07-04 DIAGNOSIS — I9589 Other hypotension: Secondary | ICD-10-CM | POA: Diagnosis not present

## 2015-07-04 DIAGNOSIS — K631 Perforation of intestine (nontraumatic): Secondary | ICD-10-CM | POA: Diagnosis not present

## 2015-07-04 DIAGNOSIS — K660 Peritoneal adhesions (postprocedural) (postinfection): Secondary | ICD-10-CM | POA: Diagnosis present

## 2015-07-04 DIAGNOSIS — R1030 Lower abdominal pain, unspecified: Secondary | ICD-10-CM | POA: Diagnosis not present

## 2015-07-04 DIAGNOSIS — K651 Peritoneal abscess: Secondary | ICD-10-CM | POA: Diagnosis not present

## 2015-07-04 DIAGNOSIS — Z9884 Bariatric surgery status: Secondary | ICD-10-CM | POA: Diagnosis not present

## 2015-07-04 DIAGNOSIS — N281 Cyst of kidney, acquired: Secondary | ICD-10-CM | POA: Diagnosis not present

## 2015-07-04 DIAGNOSIS — E860 Dehydration: Secondary | ICD-10-CM | POA: Diagnosis present

## 2015-07-04 DIAGNOSIS — A419 Sepsis, unspecified organism: Secondary | ICD-10-CM | POA: Diagnosis not present

## 2015-07-04 DIAGNOSIS — K750 Abscess of liver: Secondary | ICD-10-CM | POA: Diagnosis not present

## 2015-07-04 DIAGNOSIS — E039 Hypothyroidism, unspecified: Secondary | ICD-10-CM | POA: Diagnosis not present

## 2015-07-04 DIAGNOSIS — K559 Vascular disorder of intestine, unspecified: Secondary | ICD-10-CM | POA: Diagnosis not present

## 2015-07-04 DIAGNOSIS — K9189 Other postprocedural complications and disorders of digestive system: Secondary | ICD-10-CM | POA: Diagnosis not present

## 2015-07-04 DIAGNOSIS — Z9889 Other specified postprocedural states: Secondary | ICD-10-CM | POA: Diagnosis not present

## 2015-07-04 DIAGNOSIS — E785 Hyperlipidemia, unspecified: Secondary | ICD-10-CM | POA: Diagnosis present

## 2015-07-04 DIAGNOSIS — G894 Chronic pain syndrome: Secondary | ICD-10-CM | POA: Diagnosis not present

## 2015-07-04 DIAGNOSIS — Z452 Encounter for adjustment and management of vascular access device: Secondary | ICD-10-CM | POA: Diagnosis not present

## 2015-07-04 DIAGNOSIS — R4189 Other symptoms and signs involving cognitive functions and awareness: Secondary | ICD-10-CM | POA: Diagnosis not present

## 2015-07-04 DIAGNOSIS — N390 Urinary tract infection, site not specified: Secondary | ICD-10-CM | POA: Diagnosis not present

## 2015-07-04 DIAGNOSIS — L02211 Cutaneous abscess of abdominal wall: Secondary | ICD-10-CM | POA: Diagnosis not present

## 2015-07-04 DIAGNOSIS — G8918 Other acute postprocedural pain: Secondary | ICD-10-CM | POA: Diagnosis not present

## 2015-07-04 DIAGNOSIS — R6521 Severe sepsis with septic shock: Secondary | ICD-10-CM | POA: Diagnosis not present

## 2015-07-04 DIAGNOSIS — J90 Pleural effusion, not elsewhere classified: Secondary | ICD-10-CM | POA: Diagnosis not present

## 2015-07-04 DIAGNOSIS — Z4803 Encounter for change or removal of drains: Secondary | ICD-10-CM | POA: Diagnosis not present

## 2015-07-04 DIAGNOSIS — R509 Fever, unspecified: Secondary | ICD-10-CM | POA: Diagnosis not present

## 2015-07-04 DIAGNOSIS — E8770 Fluid overload, unspecified: Secondary | ICD-10-CM | POA: Diagnosis not present

## 2015-07-04 DIAGNOSIS — Z01818 Encounter for other preprocedural examination: Secondary | ICD-10-CM | POA: Diagnosis not present

## 2015-07-04 DIAGNOSIS — F329 Major depressive disorder, single episode, unspecified: Secondary | ICD-10-CM | POA: Diagnosis present

## 2015-07-04 DIAGNOSIS — R112 Nausea with vomiting, unspecified: Secondary | ICD-10-CM | POA: Diagnosis not present

## 2015-07-04 DIAGNOSIS — J9811 Atelectasis: Secondary | ICD-10-CM | POA: Diagnosis not present

## 2015-07-04 DIAGNOSIS — R109 Unspecified abdominal pain: Secondary | ICD-10-CM | POA: Diagnosis not present

## 2015-07-04 DIAGNOSIS — D62 Acute posthemorrhagic anemia: Secondary | ICD-10-CM | POA: Diagnosis not present

## 2015-07-04 DIAGNOSIS — G8929 Other chronic pain: Secondary | ICD-10-CM | POA: Diagnosis not present

## 2015-07-04 DIAGNOSIS — K8010 Calculus of gallbladder with chronic cholecystitis without obstruction: Secondary | ICD-10-CM | POA: Diagnosis not present

## 2015-07-04 DIAGNOSIS — R110 Nausea: Secondary | ICD-10-CM | POA: Diagnosis not present

## 2015-07-04 DIAGNOSIS — K449 Diaphragmatic hernia without obstruction or gangrene: Secondary | ICD-10-CM | POA: Diagnosis not present

## 2015-07-04 DIAGNOSIS — K5000 Crohn's disease of small intestine without complications: Secondary | ICD-10-CM | POA: Diagnosis not present

## 2015-07-04 DIAGNOSIS — T8183XA Persistent postprocedural fistula, initial encounter: Secondary | ICD-10-CM | POA: Diagnosis not present

## 2015-07-04 DIAGNOSIS — R4020 Unspecified coma: Secondary | ICD-10-CM | POA: Diagnosis not present

## 2015-07-04 DIAGNOSIS — K6389 Other specified diseases of intestine: Secondary | ICD-10-CM | POA: Diagnosis not present

## 2015-07-04 DIAGNOSIS — I9581 Postprocedural hypotension: Secondary | ICD-10-CM | POA: Diagnosis not present

## 2015-07-04 DIAGNOSIS — R0602 Shortness of breath: Secondary | ICD-10-CM | POA: Diagnosis not present

## 2015-07-23 ENCOUNTER — Other Ambulatory Visit: Payer: Medicare Other

## 2015-07-24 DIAGNOSIS — G4733 Obstructive sleep apnea (adult) (pediatric): Secondary | ICD-10-CM | POA: Diagnosis not present

## 2015-07-24 DIAGNOSIS — K9189 Other postprocedural complications and disorders of digestive system: Secondary | ICD-10-CM | POA: Diagnosis not present

## 2015-07-24 DIAGNOSIS — J069 Acute upper respiratory infection, unspecified: Secondary | ICD-10-CM | POA: Diagnosis not present

## 2015-07-24 DIAGNOSIS — M6281 Muscle weakness (generalized): Secondary | ICD-10-CM | POA: Diagnosis not present

## 2015-07-24 DIAGNOSIS — K219 Gastro-esophageal reflux disease without esophagitis: Secondary | ICD-10-CM | POA: Diagnosis not present

## 2015-07-24 DIAGNOSIS — G894 Chronic pain syndrome: Secondary | ICD-10-CM | POA: Diagnosis not present

## 2015-07-24 DIAGNOSIS — K529 Noninfective gastroenteritis and colitis, unspecified: Secondary | ICD-10-CM | POA: Diagnosis not present

## 2015-07-24 DIAGNOSIS — F332 Major depressive disorder, recurrent severe without psychotic features: Secondary | ICD-10-CM | POA: Diagnosis not present

## 2015-07-24 DIAGNOSIS — F329 Major depressive disorder, single episode, unspecified: Secondary | ICD-10-CM | POA: Diagnosis not present

## 2015-07-24 DIAGNOSIS — E039 Hypothyroidism, unspecified: Secondary | ICD-10-CM | POA: Diagnosis not present

## 2015-07-24 DIAGNOSIS — J9621 Acute and chronic respiratory failure with hypoxia: Secondary | ICD-10-CM | POA: Diagnosis present

## 2015-07-24 DIAGNOSIS — C8190 Hodgkin lymphoma, unspecified, unspecified site: Secondary | ICD-10-CM | POA: Diagnosis not present

## 2015-07-24 DIAGNOSIS — F418 Other specified anxiety disorders: Secondary | ICD-10-CM | POA: Diagnosis not present

## 2015-07-24 DIAGNOSIS — M21371 Foot drop, right foot: Secondary | ICD-10-CM | POA: Diagnosis present

## 2015-07-24 DIAGNOSIS — N110 Nonobstructive reflux-associated chronic pyelonephritis: Secondary | ICD-10-CM | POA: Diagnosis not present

## 2015-07-24 DIAGNOSIS — R109 Unspecified abdominal pain: Secondary | ICD-10-CM | POA: Diagnosis not present

## 2015-07-24 DIAGNOSIS — J45909 Unspecified asthma, uncomplicated: Secondary | ICD-10-CM | POA: Diagnosis present

## 2015-07-24 DIAGNOSIS — R339 Retention of urine, unspecified: Secondary | ICD-10-CM | POA: Diagnosis not present

## 2015-07-24 DIAGNOSIS — R5381 Other malaise: Secondary | ICD-10-CM | POA: Diagnosis not present

## 2015-07-24 DIAGNOSIS — Z980 Intestinal bypass and anastomosis status: Secondary | ICD-10-CM | POA: Diagnosis not present

## 2015-07-24 DIAGNOSIS — K5289 Other specified noninfective gastroenteritis and colitis: Secondary | ICD-10-CM | POA: Diagnosis not present

## 2015-07-24 DIAGNOSIS — N879 Dysplasia of cervix uteri, unspecified: Secondary | ICD-10-CM | POA: Diagnosis present

## 2015-07-24 DIAGNOSIS — N390 Urinary tract infection, site not specified: Secondary | ICD-10-CM | POA: Diagnosis not present

## 2015-07-24 DIAGNOSIS — Z792 Long term (current) use of antibiotics: Secondary | ICD-10-CM | POA: Diagnosis not present

## 2015-07-24 DIAGNOSIS — M21372 Foot drop, left foot: Secondary | ICD-10-CM | POA: Diagnosis present

## 2015-07-24 DIAGNOSIS — L02211 Cutaneous abscess of abdominal wall: Secondary | ICD-10-CM | POA: Diagnosis not present

## 2015-07-24 DIAGNOSIS — Z87891 Personal history of nicotine dependence: Secondary | ICD-10-CM | POA: Diagnosis not present

## 2015-07-24 DIAGNOSIS — J9811 Atelectasis: Secondary | ICD-10-CM | POA: Diagnosis not present

## 2015-07-24 DIAGNOSIS — G8220 Paraplegia, unspecified: Secondary | ICD-10-CM | POA: Diagnosis present

## 2015-07-24 DIAGNOSIS — K650 Generalized (acute) peritonitis: Secondary | ICD-10-CM | POA: Diagnosis not present

## 2015-07-24 DIAGNOSIS — E785 Hyperlipidemia, unspecified: Secondary | ICD-10-CM | POA: Diagnosis present

## 2015-07-24 DIAGNOSIS — Z9884 Bariatric surgery status: Secondary | ICD-10-CM | POA: Diagnosis not present

## 2015-07-24 DIAGNOSIS — Z8571 Personal history of Hodgkin lymphoma: Secondary | ICD-10-CM | POA: Diagnosis not present

## 2015-07-24 DIAGNOSIS — R279 Unspecified lack of coordination: Secondary | ICD-10-CM | POA: Diagnosis not present

## 2015-07-24 DIAGNOSIS — Z452 Encounter for adjustment and management of vascular access device: Secondary | ICD-10-CM | POA: Diagnosis not present

## 2015-07-24 DIAGNOSIS — Z9889 Other specified postprocedural states: Secondary | ICD-10-CM | POA: Diagnosis not present

## 2015-07-24 DIAGNOSIS — Z9119 Patient's noncompliance with other medical treatment and regimen: Secondary | ICD-10-CM | POA: Diagnosis not present

## 2015-07-24 DIAGNOSIS — D473 Essential (hemorrhagic) thrombocythemia: Secondary | ICD-10-CM | POA: Diagnosis not present

## 2015-07-24 DIAGNOSIS — D696 Thrombocytopenia, unspecified: Secondary | ICD-10-CM | POA: Diagnosis not present

## 2015-07-24 DIAGNOSIS — J4520 Mild intermittent asthma, uncomplicated: Secondary | ICD-10-CM | POA: Diagnosis not present

## 2015-07-24 DIAGNOSIS — T814XXA Infection following a procedure, initial encounter: Secondary | ICD-10-CM | POA: Diagnosis not present

## 2015-07-24 DIAGNOSIS — T80219A Unspecified infection due to central venous catheter, initial encounter: Secondary | ICD-10-CM | POA: Diagnosis not present

## 2015-07-24 DIAGNOSIS — I9589 Other hypotension: Secondary | ICD-10-CM | POA: Diagnosis not present

## 2015-07-24 DIAGNOSIS — R110 Nausea: Secondary | ICD-10-CM | POA: Diagnosis not present

## 2015-07-24 DIAGNOSIS — G4700 Insomnia, unspecified: Secondary | ICD-10-CM | POA: Diagnosis not present

## 2015-07-24 DIAGNOSIS — Z4803 Encounter for change or removal of drains: Secondary | ICD-10-CM | POA: Diagnosis not present

## 2015-07-24 DIAGNOSIS — K659 Peritonitis, unspecified: Secondary | ICD-10-CM | POA: Diagnosis present

## 2015-07-24 DIAGNOSIS — K750 Abscess of liver: Secondary | ICD-10-CM | POA: Diagnosis not present

## 2015-07-24 DIAGNOSIS — M6250 Muscle wasting and atrophy, not elsewhere classified, unspecified site: Secondary | ICD-10-CM | POA: Diagnosis not present

## 2015-07-24 DIAGNOSIS — K316 Fistula of stomach and duodenum: Secondary | ICD-10-CM | POA: Diagnosis not present

## 2015-07-24 DIAGNOSIS — R112 Nausea with vomiting, unspecified: Secondary | ICD-10-CM | POA: Diagnosis not present

## 2015-07-24 DIAGNOSIS — F419 Anxiety disorder, unspecified: Secondary | ICD-10-CM | POA: Diagnosis not present

## 2015-07-24 DIAGNOSIS — R52 Pain, unspecified: Secondary | ICD-10-CM | POA: Diagnosis not present

## 2015-07-24 DIAGNOSIS — Z515 Encounter for palliative care: Secondary | ICD-10-CM | POA: Diagnosis not present

## 2015-07-24 DIAGNOSIS — J189 Pneumonia, unspecified organism: Secondary | ICD-10-CM | POA: Diagnosis not present

## 2015-07-24 DIAGNOSIS — G8929 Other chronic pain: Secondary | ICD-10-CM | POA: Diagnosis not present

## 2015-07-24 DIAGNOSIS — G8918 Other acute postprocedural pain: Secondary | ICD-10-CM | POA: Diagnosis not present

## 2015-07-24 DIAGNOSIS — F339 Major depressive disorder, recurrent, unspecified: Secondary | ICD-10-CM | POA: Diagnosis not present

## 2015-07-24 DIAGNOSIS — K631 Perforation of intestine (nontraumatic): Secondary | ICD-10-CM | POA: Diagnosis not present

## 2015-07-24 DIAGNOSIS — E46 Unspecified protein-calorie malnutrition: Secondary | ICD-10-CM | POA: Diagnosis not present

## 2015-07-24 DIAGNOSIS — R1110 Vomiting, unspecified: Secondary | ICD-10-CM | POA: Diagnosis not present

## 2015-08-22 DIAGNOSIS — M6250 Muscle wasting and atrophy, not elsewhere classified, unspecified site: Secondary | ICD-10-CM | POA: Diagnosis not present

## 2015-08-22 DIAGNOSIS — E039 Hypothyroidism, unspecified: Secondary | ICD-10-CM | POA: Diagnosis not present

## 2015-08-22 DIAGNOSIS — F339 Major depressive disorder, recurrent, unspecified: Secondary | ICD-10-CM | POA: Diagnosis not present

## 2015-08-22 DIAGNOSIS — R279 Unspecified lack of coordination: Secondary | ICD-10-CM | POA: Diagnosis not present

## 2015-08-22 DIAGNOSIS — N390 Urinary tract infection, site not specified: Secondary | ICD-10-CM | POA: Diagnosis not present

## 2015-08-22 DIAGNOSIS — D649 Anemia, unspecified: Secondary | ICD-10-CM | POA: Diagnosis not present

## 2015-08-22 DIAGNOSIS — J984 Other disorders of lung: Secondary | ICD-10-CM | POA: Diagnosis not present

## 2015-08-22 DIAGNOSIS — E785 Hyperlipidemia, unspecified: Secondary | ICD-10-CM | POA: Diagnosis not present

## 2015-08-22 DIAGNOSIS — R918 Other nonspecific abnormal finding of lung field: Secondary | ICD-10-CM | POA: Diagnosis not present

## 2015-08-22 DIAGNOSIS — G894 Chronic pain syndrome: Secondary | ICD-10-CM | POA: Diagnosis not present

## 2015-08-22 DIAGNOSIS — D473 Essential (hemorrhagic) thrombocythemia: Secondary | ICD-10-CM | POA: Diagnosis not present

## 2015-08-22 DIAGNOSIS — J9811 Atelectasis: Secondary | ICD-10-CM | POA: Diagnosis not present

## 2015-08-22 DIAGNOSIS — Z9884 Bariatric surgery status: Secondary | ICD-10-CM | POA: Diagnosis not present

## 2015-08-22 DIAGNOSIS — K650 Generalized (acute) peritonitis: Secondary | ICD-10-CM | POA: Diagnosis not present

## 2015-08-22 DIAGNOSIS — F332 Major depressive disorder, recurrent severe without psychotic features: Secondary | ICD-10-CM | POA: Diagnosis not present

## 2015-08-22 DIAGNOSIS — K631 Perforation of intestine (nontraumatic): Secondary | ICD-10-CM | POA: Diagnosis not present

## 2015-08-22 DIAGNOSIS — C8190 Hodgkin lymphoma, unspecified, unspecified site: Secondary | ICD-10-CM | POA: Diagnosis not present

## 2015-08-22 DIAGNOSIS — R110 Nausea: Secondary | ICD-10-CM | POA: Diagnosis not present

## 2015-08-22 DIAGNOSIS — F329 Major depressive disorder, single episode, unspecified: Secondary | ICD-10-CM | POA: Diagnosis not present

## 2015-08-22 DIAGNOSIS — E876 Hypokalemia: Secondary | ICD-10-CM | POA: Diagnosis not present

## 2015-08-22 DIAGNOSIS — R112 Nausea with vomiting, unspecified: Secondary | ICD-10-CM | POA: Diagnosis not present

## 2015-08-22 DIAGNOSIS — R339 Retention of urine, unspecified: Secondary | ICD-10-CM | POA: Diagnosis not present

## 2015-08-22 DIAGNOSIS — R590 Localized enlarged lymph nodes: Secondary | ICD-10-CM | POA: Diagnosis not present

## 2015-08-22 DIAGNOSIS — M4806 Spinal stenosis, lumbar region: Secondary | ICD-10-CM | POA: Diagnosis not present

## 2015-08-22 DIAGNOSIS — K219 Gastro-esophageal reflux disease without esophagitis: Secondary | ICD-10-CM | POA: Diagnosis not present

## 2015-08-22 DIAGNOSIS — M6281 Muscle weakness (generalized): Secondary | ICD-10-CM | POA: Diagnosis not present

## 2015-08-22 DIAGNOSIS — Z48815 Encounter for surgical aftercare following surgery on the digestive system: Secondary | ICD-10-CM | POA: Diagnosis not present

## 2015-08-22 DIAGNOSIS — G4700 Insomnia, unspecified: Secondary | ICD-10-CM | POA: Diagnosis not present

## 2015-08-22 DIAGNOSIS — E860 Dehydration: Secondary | ICD-10-CM | POA: Diagnosis not present

## 2015-08-22 DIAGNOSIS — F419 Anxiety disorder, unspecified: Secondary | ICD-10-CM | POA: Diagnosis not present

## 2015-08-22 DIAGNOSIS — R1111 Vomiting without nausea: Secondary | ICD-10-CM | POA: Diagnosis not present

## 2015-08-22 DIAGNOSIS — Z9119 Patient's noncompliance with other medical treatment and regimen: Secondary | ICD-10-CM | POA: Diagnosis not present

## 2015-08-22 DIAGNOSIS — R031 Nonspecific low blood-pressure reading: Secondary | ICD-10-CM | POA: Diagnosis not present

## 2015-08-22 DIAGNOSIS — R1013 Epigastric pain: Secondary | ICD-10-CM | POA: Diagnosis not present

## 2015-08-22 DIAGNOSIS — K659 Peritonitis, unspecified: Secondary | ICD-10-CM | POA: Diagnosis not present

## 2015-08-22 DIAGNOSIS — Z743 Need for continuous supervision: Secondary | ICD-10-CM | POA: Diagnosis not present

## 2015-08-22 DIAGNOSIS — G8220 Paraplegia, unspecified: Secondary | ICD-10-CM | POA: Diagnosis not present

## 2015-08-22 DIAGNOSIS — R5381 Other malaise: Secondary | ICD-10-CM | POA: Diagnosis not present

## 2015-08-27 DIAGNOSIS — K650 Generalized (acute) peritonitis: Secondary | ICD-10-CM | POA: Diagnosis not present

## 2015-08-27 DIAGNOSIS — K631 Perforation of intestine (nontraumatic): Secondary | ICD-10-CM | POA: Diagnosis not present

## 2015-08-27 DIAGNOSIS — G894 Chronic pain syndrome: Secondary | ICD-10-CM | POA: Diagnosis not present

## 2015-08-27 DIAGNOSIS — K219 Gastro-esophageal reflux disease without esophagitis: Secondary | ICD-10-CM | POA: Diagnosis not present

## 2015-08-29 DIAGNOSIS — K631 Perforation of intestine (nontraumatic): Secondary | ICD-10-CM | POA: Diagnosis not present

## 2015-08-29 DIAGNOSIS — G894 Chronic pain syndrome: Secondary | ICD-10-CM | POA: Diagnosis not present

## 2015-08-29 DIAGNOSIS — K650 Generalized (acute) peritonitis: Secondary | ICD-10-CM | POA: Diagnosis not present

## 2015-08-29 DIAGNOSIS — K219 Gastro-esophageal reflux disease without esophagitis: Secondary | ICD-10-CM | POA: Diagnosis not present

## 2015-09-06 DIAGNOSIS — K631 Perforation of intestine (nontraumatic): Secondary | ICD-10-CM | POA: Diagnosis not present

## 2015-09-06 DIAGNOSIS — G894 Chronic pain syndrome: Secondary | ICD-10-CM | POA: Diagnosis not present

## 2015-09-06 DIAGNOSIS — K219 Gastro-esophageal reflux disease without esophagitis: Secondary | ICD-10-CM | POA: Diagnosis not present

## 2015-09-06 DIAGNOSIS — K650 Generalized (acute) peritonitis: Secondary | ICD-10-CM | POA: Diagnosis not present

## 2015-09-08 DIAGNOSIS — K219 Gastro-esophageal reflux disease without esophagitis: Secondary | ICD-10-CM | POA: Diagnosis not present

## 2015-09-08 DIAGNOSIS — K631 Perforation of intestine (nontraumatic): Secondary | ICD-10-CM | POA: Diagnosis not present

## 2015-09-08 DIAGNOSIS — K650 Generalized (acute) peritonitis: Secondary | ICD-10-CM | POA: Diagnosis not present

## 2015-09-08 DIAGNOSIS — G894 Chronic pain syndrome: Secondary | ICD-10-CM | POA: Diagnosis not present

## 2015-09-10 DIAGNOSIS — K650 Generalized (acute) peritonitis: Secondary | ICD-10-CM | POA: Diagnosis not present

## 2015-09-10 DIAGNOSIS — K631 Perforation of intestine (nontraumatic): Secondary | ICD-10-CM | POA: Diagnosis not present

## 2015-09-10 DIAGNOSIS — K219 Gastro-esophageal reflux disease without esophagitis: Secondary | ICD-10-CM | POA: Diagnosis not present

## 2015-09-10 DIAGNOSIS — G894 Chronic pain syndrome: Secondary | ICD-10-CM | POA: Diagnosis not present

## 2015-09-11 DIAGNOSIS — K219 Gastro-esophageal reflux disease without esophagitis: Secondary | ICD-10-CM | POA: Diagnosis not present

## 2015-09-11 DIAGNOSIS — G894 Chronic pain syndrome: Secondary | ICD-10-CM | POA: Diagnosis not present

## 2015-09-11 DIAGNOSIS — K631 Perforation of intestine (nontraumatic): Secondary | ICD-10-CM | POA: Diagnosis not present

## 2015-09-11 DIAGNOSIS — K650 Generalized (acute) peritonitis: Secondary | ICD-10-CM | POA: Diagnosis not present

## 2015-09-20 DIAGNOSIS — M4806 Spinal stenosis, lumbar region: Secondary | ICD-10-CM | POA: Diagnosis not present

## 2015-09-20 DIAGNOSIS — K219 Gastro-esophageal reflux disease without esophagitis: Secondary | ICD-10-CM | POA: Diagnosis not present

## 2015-09-25 DIAGNOSIS — Z9884 Bariatric surgery status: Secondary | ICD-10-CM | POA: Diagnosis not present

## 2015-09-25 DIAGNOSIS — Z48815 Encounter for surgical aftercare following surgery on the digestive system: Secondary | ICD-10-CM | POA: Diagnosis not present

## 2015-09-27 DIAGNOSIS — K631 Perforation of intestine (nontraumatic): Secondary | ICD-10-CM | POA: Diagnosis not present

## 2015-09-27 DIAGNOSIS — K650 Generalized (acute) peritonitis: Secondary | ICD-10-CM | POA: Diagnosis not present

## 2015-09-27 DIAGNOSIS — K219 Gastro-esophageal reflux disease without esophagitis: Secondary | ICD-10-CM | POA: Diagnosis not present

## 2015-09-27 DIAGNOSIS — G894 Chronic pain syndrome: Secondary | ICD-10-CM | POA: Diagnosis not present

## 2015-10-02 DIAGNOSIS — G894 Chronic pain syndrome: Secondary | ICD-10-CM | POA: Diagnosis not present

## 2015-10-02 DIAGNOSIS — K650 Generalized (acute) peritonitis: Secondary | ICD-10-CM | POA: Diagnosis not present

## 2015-10-02 DIAGNOSIS — K219 Gastro-esophageal reflux disease without esophagitis: Secondary | ICD-10-CM | POA: Diagnosis not present

## 2015-10-02 DIAGNOSIS — K631 Perforation of intestine (nontraumatic): Secondary | ICD-10-CM | POA: Diagnosis not present

## 2015-10-07 DIAGNOSIS — G894 Chronic pain syndrome: Secondary | ICD-10-CM | POA: Diagnosis not present

## 2015-10-07 DIAGNOSIS — K650 Generalized (acute) peritonitis: Secondary | ICD-10-CM | POA: Diagnosis not present

## 2015-10-07 DIAGNOSIS — K219 Gastro-esophageal reflux disease without esophagitis: Secondary | ICD-10-CM | POA: Diagnosis not present

## 2015-10-07 DIAGNOSIS — K631 Perforation of intestine (nontraumatic): Secondary | ICD-10-CM | POA: Diagnosis not present

## 2015-10-09 DIAGNOSIS — K631 Perforation of intestine (nontraumatic): Secondary | ICD-10-CM | POA: Diagnosis not present

## 2015-10-09 DIAGNOSIS — G894 Chronic pain syndrome: Secondary | ICD-10-CM | POA: Diagnosis not present

## 2015-10-09 DIAGNOSIS — K650 Generalized (acute) peritonitis: Secondary | ICD-10-CM | POA: Diagnosis not present

## 2015-10-09 DIAGNOSIS — K219 Gastro-esophageal reflux disease without esophagitis: Secondary | ICD-10-CM | POA: Diagnosis not present

## 2015-10-10 DIAGNOSIS — G894 Chronic pain syndrome: Secondary | ICD-10-CM | POA: Diagnosis not present

## 2015-10-10 DIAGNOSIS — K631 Perforation of intestine (nontraumatic): Secondary | ICD-10-CM | POA: Diagnosis not present

## 2015-10-10 DIAGNOSIS — K219 Gastro-esophageal reflux disease without esophagitis: Secondary | ICD-10-CM | POA: Diagnosis not present

## 2015-10-10 DIAGNOSIS — K650 Generalized (acute) peritonitis: Secondary | ICD-10-CM | POA: Diagnosis not present

## 2015-10-11 DIAGNOSIS — E876 Hypokalemia: Secondary | ICD-10-CM | POA: Diagnosis not present

## 2015-10-11 DIAGNOSIS — R110 Nausea: Secondary | ICD-10-CM | POA: Diagnosis not present

## 2015-10-11 DIAGNOSIS — E860 Dehydration: Secondary | ICD-10-CM | POA: Diagnosis not present

## 2015-10-11 DIAGNOSIS — R1013 Epigastric pain: Secondary | ICD-10-CM | POA: Diagnosis not present

## 2015-10-12 DIAGNOSIS — E860 Dehydration: Secondary | ICD-10-CM | POA: Diagnosis present

## 2015-10-12 DIAGNOSIS — R1013 Epigastric pain: Secondary | ICD-10-CM | POA: Diagnosis not present

## 2015-10-12 DIAGNOSIS — E039 Hypothyroidism, unspecified: Secondary | ICD-10-CM | POA: Diagnosis present

## 2015-10-12 DIAGNOSIS — F419 Anxiety disorder, unspecified: Secondary | ICD-10-CM | POA: Diagnosis not present

## 2015-10-12 DIAGNOSIS — D473 Essential (hemorrhagic) thrombocythemia: Secondary | ICD-10-CM | POA: Diagnosis present

## 2015-10-12 DIAGNOSIS — E876 Hypokalemia: Secondary | ICD-10-CM | POA: Diagnosis not present

## 2015-10-12 DIAGNOSIS — K449 Diaphragmatic hernia without obstruction or gangrene: Secondary | ICD-10-CM | POA: Diagnosis present

## 2015-10-12 DIAGNOSIS — M4646 Discitis, unspecified, lumbar region: Secondary | ICD-10-CM | POA: Diagnosis not present

## 2015-10-12 DIAGNOSIS — K920 Hematemesis: Secondary | ICD-10-CM | POA: Diagnosis present

## 2015-10-12 DIAGNOSIS — R109 Unspecified abdominal pain: Secondary | ICD-10-CM | POA: Diagnosis not present

## 2015-10-12 DIAGNOSIS — M25552 Pain in left hip: Secondary | ICD-10-CM | POA: Diagnosis present

## 2015-10-12 DIAGNOSIS — J150 Pneumonia due to Klebsiella pneumoniae: Secondary | ICD-10-CM | POA: Diagnosis not present

## 2015-10-12 DIAGNOSIS — K219 Gastro-esophageal reflux disease without esophagitis: Secondary | ICD-10-CM | POA: Diagnosis present

## 2015-10-12 DIAGNOSIS — G8220 Paraplegia, unspecified: Secondary | ICD-10-CM | POA: Diagnosis present

## 2015-10-12 DIAGNOSIS — R7989 Other specified abnormal findings of blood chemistry: Secondary | ICD-10-CM | POA: Diagnosis not present

## 2015-10-12 DIAGNOSIS — R918 Other nonspecific abnormal finding of lung field: Secondary | ICD-10-CM | POA: Diagnosis not present

## 2015-10-12 DIAGNOSIS — M79661 Pain in right lower leg: Secondary | ICD-10-CM | POA: Diagnosis present

## 2015-10-12 DIAGNOSIS — E46 Unspecified protein-calorie malnutrition: Secondary | ICD-10-CM | POA: Diagnosis not present

## 2015-10-12 DIAGNOSIS — F331 Major depressive disorder, recurrent, moderate: Secondary | ICD-10-CM | POA: Diagnosis not present

## 2015-10-12 DIAGNOSIS — E669 Obesity, unspecified: Secondary | ICD-10-CM | POA: Diagnosis present

## 2015-10-12 DIAGNOSIS — T82898A Other specified complication of vascular prosthetic devices, implants and grafts, initial encounter: Secondary | ICD-10-CM | POA: Diagnosis not present

## 2015-10-12 DIAGNOSIS — G062 Extradural and subdural abscess, unspecified: Secondary | ICD-10-CM | POA: Diagnosis not present

## 2015-10-12 DIAGNOSIS — R110 Nausea: Secondary | ICD-10-CM | POA: Diagnosis not present

## 2015-10-12 DIAGNOSIS — M2550 Pain in unspecified joint: Secondary | ICD-10-CM | POA: Diagnosis not present

## 2015-10-12 DIAGNOSIS — G894 Chronic pain syndrome: Secondary | ICD-10-CM | POA: Diagnosis present

## 2015-10-12 DIAGNOSIS — M4626 Osteomyelitis of vertebra, lumbar region: Secondary | ICD-10-CM | POA: Diagnosis not present

## 2015-10-12 DIAGNOSIS — R112 Nausea with vomiting, unspecified: Secondary | ICD-10-CM | POA: Diagnosis not present

## 2015-10-12 DIAGNOSIS — A419 Sepsis, unspecified organism: Secondary | ICD-10-CM | POA: Diagnosis present

## 2015-10-12 DIAGNOSIS — J690 Pneumonitis due to inhalation of food and vomit: Secondary | ICD-10-CM | POA: Diagnosis not present

## 2015-10-12 DIAGNOSIS — Z7401 Bed confinement status: Secondary | ICD-10-CM | POA: Diagnosis not present

## 2015-10-12 DIAGNOSIS — K8020 Calculus of gallbladder without cholecystitis without obstruction: Secondary | ICD-10-CM | POA: Diagnosis not present

## 2015-10-12 DIAGNOSIS — E785 Hyperlipidemia, unspecified: Secondary | ICD-10-CM | POA: Diagnosis present

## 2015-10-12 DIAGNOSIS — B3749 Other urogenital candidiasis: Secondary | ICD-10-CM | POA: Diagnosis not present

## 2015-10-12 DIAGNOSIS — M25551 Pain in right hip: Secondary | ICD-10-CM | POA: Diagnosis present

## 2015-10-12 DIAGNOSIS — F17210 Nicotine dependence, cigarettes, uncomplicated: Secondary | ICD-10-CM | POA: Diagnosis present

## 2015-10-17 DIAGNOSIS — Z923 Personal history of irradiation: Secondary | ICD-10-CM | POA: Diagnosis not present

## 2015-10-17 DIAGNOSIS — Z8674 Personal history of sudden cardiac arrest: Secondary | ICD-10-CM | POA: Diagnosis not present

## 2015-10-17 DIAGNOSIS — Z87410 Personal history of cervical dysplasia: Secondary | ICD-10-CM | POA: Diagnosis not present

## 2015-10-17 DIAGNOSIS — R5381 Other malaise: Secondary | ICD-10-CM | POA: Diagnosis not present

## 2015-10-17 DIAGNOSIS — M8668 Other chronic osteomyelitis, other site: Secondary | ICD-10-CM | POA: Diagnosis not present

## 2015-10-17 DIAGNOSIS — M4620 Osteomyelitis of vertebra, site unspecified: Secondary | ICD-10-CM | POA: Diagnosis not present

## 2015-10-17 DIAGNOSIS — Z980 Intestinal bypass and anastomosis status: Secondary | ICD-10-CM | POA: Diagnosis not present

## 2015-10-17 DIAGNOSIS — F419 Anxiety disorder, unspecified: Secondary | ICD-10-CM | POA: Diagnosis present

## 2015-10-17 DIAGNOSIS — F321 Major depressive disorder, single episode, moderate: Secondary | ICD-10-CM | POA: Diagnosis not present

## 2015-10-17 DIAGNOSIS — Z418 Encounter for other procedures for purposes other than remedying health state: Secondary | ICD-10-CM | POA: Diagnosis not present

## 2015-10-17 DIAGNOSIS — G894 Chronic pain syndrome: Secondary | ICD-10-CM | POA: Diagnosis present

## 2015-10-17 DIAGNOSIS — Z87891 Personal history of nicotine dependence: Secondary | ICD-10-CM | POA: Diagnosis not present

## 2015-10-17 DIAGNOSIS — R109 Unspecified abdominal pain: Secondary | ICD-10-CM | POA: Diagnosis not present

## 2015-10-17 DIAGNOSIS — F4320 Adjustment disorder, unspecified: Secondary | ICD-10-CM | POA: Diagnosis not present

## 2015-10-17 DIAGNOSIS — Z8619 Personal history of other infectious and parasitic diseases: Secondary | ICD-10-CM | POA: Diagnosis not present

## 2015-10-17 DIAGNOSIS — M799 Soft tissue disorder, unspecified: Secondary | ICD-10-CM | POA: Diagnosis not present

## 2015-10-17 DIAGNOSIS — Z8571 Personal history of Hodgkin lymphoma: Secondary | ICD-10-CM | POA: Diagnosis not present

## 2015-10-17 DIAGNOSIS — K651 Peritoneal abscess: Secondary | ICD-10-CM | POA: Diagnosis not present

## 2015-10-17 DIAGNOSIS — F329 Major depressive disorder, single episode, unspecified: Secondary | ICD-10-CM | POA: Diagnosis present

## 2015-10-17 DIAGNOSIS — R339 Retention of urine, unspecified: Secondary | ICD-10-CM | POA: Diagnosis not present

## 2015-10-17 DIAGNOSIS — R1011 Right upper quadrant pain: Secondary | ICD-10-CM | POA: Diagnosis not present

## 2015-10-17 DIAGNOSIS — M5412 Radiculopathy, cervical region: Secondary | ICD-10-CM | POA: Diagnosis not present

## 2015-10-17 DIAGNOSIS — E43 Unspecified severe protein-calorie malnutrition: Secondary | ICD-10-CM | POA: Diagnosis present

## 2015-10-17 DIAGNOSIS — Z9884 Bariatric surgery status: Secondary | ICD-10-CM | POA: Diagnosis not present

## 2015-10-17 DIAGNOSIS — M8610 Other acute osteomyelitis, unspecified site: Secondary | ICD-10-CM | POA: Diagnosis not present

## 2015-10-17 DIAGNOSIS — I824Z2 Acute embolism and thrombosis of unspecified deep veins of left distal lower extremity: Secondary | ICD-10-CM | POA: Diagnosis not present

## 2015-10-17 DIAGNOSIS — B961 Klebsiella pneumoniae [K. pneumoniae] as the cause of diseases classified elsewhere: Secondary | ICD-10-CM | POA: Diagnosis not present

## 2015-10-17 DIAGNOSIS — N390 Urinary tract infection, site not specified: Secondary | ICD-10-CM | POA: Diagnosis not present

## 2015-10-17 DIAGNOSIS — Z993 Dependence on wheelchair: Secondary | ICD-10-CM | POA: Diagnosis not present

## 2015-10-17 DIAGNOSIS — B9689 Other specified bacterial agents as the cause of diseases classified elsewhere: Secondary | ICD-10-CM | POA: Diagnosis not present

## 2015-10-17 DIAGNOSIS — E8770 Fluid overload, unspecified: Secondary | ICD-10-CM | POA: Diagnosis present

## 2015-10-17 DIAGNOSIS — Z9081 Acquired absence of spleen: Secondary | ICD-10-CM | POA: Diagnosis not present

## 2015-10-17 DIAGNOSIS — E785 Hyperlipidemia, unspecified: Secondary | ICD-10-CM | POA: Diagnosis present

## 2015-10-17 DIAGNOSIS — R531 Weakness: Secondary | ICD-10-CM | POA: Diagnosis not present

## 2015-10-17 DIAGNOSIS — R601 Generalized edema: Secondary | ICD-10-CM | POA: Diagnosis not present

## 2015-10-17 DIAGNOSIS — G8220 Paraplegia, unspecified: Secondary | ICD-10-CM | POA: Diagnosis present

## 2015-10-17 DIAGNOSIS — M4806 Spinal stenosis, lumbar region: Secondary | ICD-10-CM | POA: Diagnosis not present

## 2015-10-17 DIAGNOSIS — M961 Postlaminectomy syndrome, not elsewhere classified: Secondary | ICD-10-CM | POA: Diagnosis not present

## 2015-10-17 DIAGNOSIS — M21372 Foot drop, left foot: Secondary | ICD-10-CM | POA: Diagnosis present

## 2015-10-17 DIAGNOSIS — M6250 Muscle wasting and atrophy, not elsewhere classified, unspecified site: Secondary | ICD-10-CM | POA: Diagnosis not present

## 2015-10-17 DIAGNOSIS — R918 Other nonspecific abnormal finding of lung field: Secondary | ICD-10-CM | POA: Diagnosis not present

## 2015-10-17 DIAGNOSIS — M6281 Muscle weakness (generalized): Secondary | ICD-10-CM | POA: Diagnosis not present

## 2015-10-17 DIAGNOSIS — M4626 Osteomyelitis of vertebra, lumbar region: Secondary | ICD-10-CM | POA: Diagnosis present

## 2015-10-17 DIAGNOSIS — Z9889 Other specified postprocedural states: Secondary | ICD-10-CM | POA: Diagnosis not present

## 2015-10-17 DIAGNOSIS — Z90710 Acquired absence of both cervix and uterus: Secondary | ICD-10-CM | POA: Diagnosis not present

## 2015-10-17 DIAGNOSIS — R110 Nausea: Secondary | ICD-10-CM | POA: Diagnosis not present

## 2015-10-17 DIAGNOSIS — F320 Major depressive disorder, single episode, mild: Secondary | ICD-10-CM | POA: Diagnosis not present

## 2015-10-17 DIAGNOSIS — E569 Vitamin deficiency, unspecified: Secondary | ICD-10-CM | POA: Diagnosis not present

## 2015-10-17 DIAGNOSIS — J45909 Unspecified asthma, uncomplicated: Secondary | ICD-10-CM | POA: Diagnosis present

## 2015-10-17 DIAGNOSIS — Z903 Acquired absence of stomach [part of]: Secondary | ICD-10-CM | POA: Diagnosis not present

## 2015-10-17 DIAGNOSIS — F411 Generalized anxiety disorder: Secondary | ICD-10-CM | POA: Diagnosis not present

## 2015-10-17 DIAGNOSIS — M21371 Foot drop, right foot: Secondary | ICD-10-CM | POA: Diagnosis present

## 2015-10-17 DIAGNOSIS — K219 Gastro-esophageal reflux disease without esophagitis: Secondary | ICD-10-CM | POA: Diagnosis not present

## 2015-10-17 DIAGNOSIS — J9811 Atelectasis: Secondary | ICD-10-CM | POA: Diagnosis not present

## 2015-10-17 DIAGNOSIS — G061 Intraspinal abscess and granuloma: Secondary | ICD-10-CM | POA: Diagnosis present

## 2015-10-17 DIAGNOSIS — J90 Pleural effusion, not elsewhere classified: Secondary | ICD-10-CM | POA: Diagnosis not present

## 2015-10-17 DIAGNOSIS — E039 Hypothyroidism, unspecified: Secondary | ICD-10-CM | POA: Diagnosis present

## 2015-10-17 DIAGNOSIS — R112 Nausea with vomiting, unspecified: Secondary | ICD-10-CM | POA: Diagnosis not present

## 2015-10-23 DIAGNOSIS — R531 Weakness: Secondary | ICD-10-CM | POA: Diagnosis not present

## 2015-10-23 DIAGNOSIS — K631 Perforation of intestine (nontraumatic): Secondary | ICD-10-CM | POA: Diagnosis not present

## 2015-10-23 DIAGNOSIS — Z452 Encounter for adjustment and management of vascular access device: Secondary | ICD-10-CM | POA: Diagnosis not present

## 2015-10-23 DIAGNOSIS — M961 Postlaminectomy syndrome, not elsewhere classified: Secondary | ICD-10-CM | POA: Diagnosis not present

## 2015-10-23 DIAGNOSIS — Z418 Encounter for other procedures for purposes other than remedying health state: Secondary | ICD-10-CM | POA: Diagnosis not present

## 2015-10-23 DIAGNOSIS — M4626 Osteomyelitis of vertebra, lumbar region: Secondary | ICD-10-CM | POA: Diagnosis not present

## 2015-10-23 DIAGNOSIS — R110 Nausea: Secondary | ICD-10-CM | POA: Diagnosis not present

## 2015-10-23 DIAGNOSIS — F320 Major depressive disorder, single episode, mild: Secondary | ICD-10-CM | POA: Diagnosis not present

## 2015-10-23 DIAGNOSIS — E569 Vitamin deficiency, unspecified: Secondary | ICD-10-CM | POA: Diagnosis not present

## 2015-10-23 DIAGNOSIS — I824Z2 Acute embolism and thrombosis of unspecified deep veins of left distal lower extremity: Secondary | ICD-10-CM | POA: Diagnosis not present

## 2015-10-23 DIAGNOSIS — K650 Generalized (acute) peritonitis: Secondary | ICD-10-CM | POA: Diagnosis not present

## 2015-10-23 DIAGNOSIS — M6281 Muscle weakness (generalized): Secondary | ICD-10-CM | POA: Diagnosis not present

## 2015-10-23 DIAGNOSIS — R109 Unspecified abdominal pain: Secondary | ICD-10-CM | POA: Diagnosis not present

## 2015-10-23 DIAGNOSIS — R601 Generalized edema: Secondary | ICD-10-CM | POA: Diagnosis not present

## 2015-10-23 DIAGNOSIS — R5381 Other malaise: Secondary | ICD-10-CM | POA: Diagnosis not present

## 2015-10-23 DIAGNOSIS — R1011 Right upper quadrant pain: Secondary | ICD-10-CM | POA: Diagnosis not present

## 2015-10-23 DIAGNOSIS — F321 Major depressive disorder, single episode, moderate: Secondary | ICD-10-CM | POA: Diagnosis not present

## 2015-10-23 DIAGNOSIS — M6250 Muscle wasting and atrophy, not elsewhere classified, unspecified site: Secondary | ICD-10-CM | POA: Diagnosis not present

## 2015-10-23 DIAGNOSIS — J9811 Atelectasis: Secondary | ICD-10-CM | POA: Diagnosis not present

## 2015-10-23 DIAGNOSIS — F411 Generalized anxiety disorder: Secondary | ICD-10-CM | POA: Diagnosis not present

## 2015-10-23 DIAGNOSIS — Z980 Intestinal bypass and anastomosis status: Secondary | ICD-10-CM | POA: Diagnosis not present

## 2015-10-23 DIAGNOSIS — I82432 Acute embolism and thrombosis of left popliteal vein: Secondary | ICD-10-CM | POA: Diagnosis not present

## 2015-10-23 DIAGNOSIS — M5412 Radiculopathy, cervical region: Secondary | ICD-10-CM | POA: Diagnosis not present

## 2015-10-23 DIAGNOSIS — M8610 Other acute osteomyelitis, unspecified site: Secondary | ICD-10-CM | POA: Diagnosis not present

## 2015-10-23 DIAGNOSIS — E039 Hypothyroidism, unspecified: Secondary | ICD-10-CM | POA: Diagnosis not present

## 2015-10-23 DIAGNOSIS — R339 Retention of urine, unspecified: Secondary | ICD-10-CM | POA: Diagnosis not present

## 2015-10-23 DIAGNOSIS — Z8571 Personal history of Hodgkin lymphoma: Secondary | ICD-10-CM | POA: Diagnosis not present

## 2015-10-23 DIAGNOSIS — M4646 Discitis, unspecified, lumbar region: Secondary | ICD-10-CM | POA: Diagnosis not present

## 2015-10-23 DIAGNOSIS — G894 Chronic pain syndrome: Secondary | ICD-10-CM | POA: Diagnosis not present

## 2015-10-23 DIAGNOSIS — Z9889 Other specified postprocedural states: Secondary | ICD-10-CM | POA: Diagnosis not present

## 2015-10-23 DIAGNOSIS — K219 Gastro-esophageal reflux disease without esophagitis: Secondary | ICD-10-CM | POA: Diagnosis not present

## 2015-10-23 DIAGNOSIS — M47816 Spondylosis without myelopathy or radiculopathy, lumbar region: Secondary | ICD-10-CM | POA: Diagnosis not present

## 2015-10-23 DIAGNOSIS — F4320 Adjustment disorder, unspecified: Secondary | ICD-10-CM | POA: Diagnosis not present

## 2015-10-23 DIAGNOSIS — R112 Nausea with vomiting, unspecified: Secondary | ICD-10-CM | POA: Diagnosis not present

## 2015-10-23 DIAGNOSIS — Z9081 Acquired absence of spleen: Secondary | ICD-10-CM | POA: Diagnosis not present

## 2015-10-23 DIAGNOSIS — F419 Anxiety disorder, unspecified: Secondary | ICD-10-CM | POA: Diagnosis not present

## 2015-10-23 DIAGNOSIS — Z792 Long term (current) use of antibiotics: Secondary | ICD-10-CM | POA: Diagnosis not present

## 2015-10-23 DIAGNOSIS — Z9884 Bariatric surgery status: Secondary | ICD-10-CM | POA: Diagnosis not present

## 2015-10-24 DIAGNOSIS — K650 Generalized (acute) peritonitis: Secondary | ICD-10-CM | POA: Diagnosis not present

## 2015-10-24 DIAGNOSIS — K219 Gastro-esophageal reflux disease without esophagitis: Secondary | ICD-10-CM | POA: Diagnosis not present

## 2015-10-24 DIAGNOSIS — K631 Perforation of intestine (nontraumatic): Secondary | ICD-10-CM | POA: Diagnosis not present

## 2015-10-24 DIAGNOSIS — G894 Chronic pain syndrome: Secondary | ICD-10-CM | POA: Diagnosis not present

## 2015-10-28 DIAGNOSIS — K650 Generalized (acute) peritonitis: Secondary | ICD-10-CM | POA: Diagnosis not present

## 2015-10-28 DIAGNOSIS — G894 Chronic pain syndrome: Secondary | ICD-10-CM | POA: Diagnosis not present

## 2015-10-28 DIAGNOSIS — K631 Perforation of intestine (nontraumatic): Secondary | ICD-10-CM | POA: Diagnosis not present

## 2015-10-28 DIAGNOSIS — K219 Gastro-esophageal reflux disease without esophagitis: Secondary | ICD-10-CM | POA: Diagnosis not present

## 2015-10-31 DIAGNOSIS — G894 Chronic pain syndrome: Secondary | ICD-10-CM | POA: Diagnosis not present

## 2015-10-31 DIAGNOSIS — K219 Gastro-esophageal reflux disease without esophagitis: Secondary | ICD-10-CM | POA: Diagnosis not present

## 2015-10-31 DIAGNOSIS — K650 Generalized (acute) peritonitis: Secondary | ICD-10-CM | POA: Diagnosis not present

## 2015-10-31 DIAGNOSIS — K631 Perforation of intestine (nontraumatic): Secondary | ICD-10-CM | POA: Diagnosis not present

## 2015-11-11 DIAGNOSIS — M4646 Discitis, unspecified, lumbar region: Secondary | ICD-10-CM | POA: Diagnosis not present

## 2015-11-11 DIAGNOSIS — R339 Retention of urine, unspecified: Secondary | ICD-10-CM | POA: Diagnosis not present

## 2015-11-11 DIAGNOSIS — Z792 Long term (current) use of antibiotics: Secondary | ICD-10-CM | POA: Diagnosis not present

## 2015-11-11 DIAGNOSIS — Z452 Encounter for adjustment and management of vascular access device: Secondary | ICD-10-CM | POA: Diagnosis not present

## 2015-11-19 DIAGNOSIS — K631 Perforation of intestine (nontraumatic): Secondary | ICD-10-CM | POA: Diagnosis not present

## 2015-11-19 DIAGNOSIS — K219 Gastro-esophageal reflux disease without esophagitis: Secondary | ICD-10-CM | POA: Diagnosis not present

## 2015-11-19 DIAGNOSIS — G894 Chronic pain syndrome: Secondary | ICD-10-CM | POA: Diagnosis not present

## 2015-11-19 DIAGNOSIS — K650 Generalized (acute) peritonitis: Secondary | ICD-10-CM | POA: Diagnosis not present

## 2015-11-28 DIAGNOSIS — G894 Chronic pain syndrome: Secondary | ICD-10-CM | POA: Diagnosis not present

## 2015-11-28 DIAGNOSIS — K631 Perforation of intestine (nontraumatic): Secondary | ICD-10-CM | POA: Diagnosis not present

## 2015-11-28 DIAGNOSIS — K219 Gastro-esophageal reflux disease without esophagitis: Secondary | ICD-10-CM | POA: Diagnosis not present

## 2015-11-28 DIAGNOSIS — K650 Generalized (acute) peritonitis: Secondary | ICD-10-CM | POA: Diagnosis not present

## 2015-12-05 DIAGNOSIS — K650 Generalized (acute) peritonitis: Secondary | ICD-10-CM | POA: Diagnosis not present

## 2015-12-05 DIAGNOSIS — K219 Gastro-esophageal reflux disease without esophagitis: Secondary | ICD-10-CM | POA: Diagnosis not present

## 2015-12-05 DIAGNOSIS — G894 Chronic pain syndrome: Secondary | ICD-10-CM | POA: Diagnosis not present

## 2015-12-05 DIAGNOSIS — K631 Perforation of intestine (nontraumatic): Secondary | ICD-10-CM | POA: Diagnosis not present

## 2015-12-09 DIAGNOSIS — G894 Chronic pain syndrome: Secondary | ICD-10-CM | POA: Diagnosis not present

## 2015-12-09 DIAGNOSIS — K219 Gastro-esophageal reflux disease without esophagitis: Secondary | ICD-10-CM | POA: Diagnosis not present

## 2015-12-09 DIAGNOSIS — K631 Perforation of intestine (nontraumatic): Secondary | ICD-10-CM | POA: Diagnosis not present

## 2015-12-09 DIAGNOSIS — K650 Generalized (acute) peritonitis: Secondary | ICD-10-CM | POA: Diagnosis not present

## 2015-12-15 DIAGNOSIS — I824Z2 Acute embolism and thrombosis of unspecified deep veins of left distal lower extremity: Secondary | ICD-10-CM | POA: Diagnosis not present

## 2015-12-15 DIAGNOSIS — M961 Postlaminectomy syndrome, not elsewhere classified: Secondary | ICD-10-CM | POA: Diagnosis not present

## 2015-12-15 DIAGNOSIS — M5412 Radiculopathy, cervical region: Secondary | ICD-10-CM | POA: Diagnosis not present

## 2015-12-15 DIAGNOSIS — G894 Chronic pain syndrome: Secondary | ICD-10-CM | POA: Diagnosis not present

## 2015-12-15 DIAGNOSIS — M62838 Other muscle spasm: Secondary | ICD-10-CM | POA: Diagnosis not present

## 2015-12-15 DIAGNOSIS — G8220 Paraplegia, unspecified: Secondary | ICD-10-CM | POA: Diagnosis not present

## 2015-12-16 DIAGNOSIS — I824Z2 Acute embolism and thrombosis of unspecified deep veins of left distal lower extremity: Secondary | ICD-10-CM | POA: Diagnosis not present

## 2015-12-16 DIAGNOSIS — M961 Postlaminectomy syndrome, not elsewhere classified: Secondary | ICD-10-CM | POA: Diagnosis not present

## 2015-12-16 DIAGNOSIS — G8220 Paraplegia, unspecified: Secondary | ICD-10-CM | POA: Diagnosis not present

## 2015-12-16 DIAGNOSIS — M5412 Radiculopathy, cervical region: Secondary | ICD-10-CM | POA: Diagnosis not present

## 2015-12-16 DIAGNOSIS — M62838 Other muscle spasm: Secondary | ICD-10-CM | POA: Diagnosis not present

## 2015-12-16 DIAGNOSIS — G894 Chronic pain syndrome: Secondary | ICD-10-CM | POA: Diagnosis not present

## 2015-12-17 DIAGNOSIS — G894 Chronic pain syndrome: Secondary | ICD-10-CM | POA: Diagnosis not present

## 2015-12-17 DIAGNOSIS — M62838 Other muscle spasm: Secondary | ICD-10-CM | POA: Diagnosis not present

## 2015-12-17 DIAGNOSIS — G8220 Paraplegia, unspecified: Secondary | ICD-10-CM | POA: Diagnosis not present

## 2015-12-17 DIAGNOSIS — I824Z2 Acute embolism and thrombosis of unspecified deep veins of left distal lower extremity: Secondary | ICD-10-CM | POA: Diagnosis not present

## 2015-12-17 DIAGNOSIS — M961 Postlaminectomy syndrome, not elsewhere classified: Secondary | ICD-10-CM | POA: Diagnosis not present

## 2015-12-17 DIAGNOSIS — M5412 Radiculopathy, cervical region: Secondary | ICD-10-CM | POA: Diagnosis not present

## 2015-12-18 DIAGNOSIS — G8220 Paraplegia, unspecified: Secondary | ICD-10-CM | POA: Diagnosis not present

## 2015-12-18 DIAGNOSIS — M961 Postlaminectomy syndrome, not elsewhere classified: Secondary | ICD-10-CM | POA: Diagnosis not present

## 2015-12-18 DIAGNOSIS — M5412 Radiculopathy, cervical region: Secondary | ICD-10-CM | POA: Diagnosis not present

## 2015-12-18 DIAGNOSIS — G894 Chronic pain syndrome: Secondary | ICD-10-CM | POA: Diagnosis not present

## 2015-12-18 DIAGNOSIS — M62838 Other muscle spasm: Secondary | ICD-10-CM | POA: Diagnosis not present

## 2015-12-18 DIAGNOSIS — I824Z2 Acute embolism and thrombosis of unspecified deep veins of left distal lower extremity: Secondary | ICD-10-CM | POA: Diagnosis not present

## 2015-12-19 DIAGNOSIS — G894 Chronic pain syndrome: Secondary | ICD-10-CM | POA: Diagnosis not present

## 2015-12-19 DIAGNOSIS — K219 Gastro-esophageal reflux disease without esophagitis: Secondary | ICD-10-CM | POA: Diagnosis not present

## 2015-12-19 DIAGNOSIS — G8220 Paraplegia, unspecified: Secondary | ICD-10-CM | POA: Diagnosis not present

## 2015-12-19 DIAGNOSIS — Z886 Allergy status to analgesic agent status: Secondary | ICD-10-CM | POA: Diagnosis not present

## 2015-12-19 DIAGNOSIS — K8020 Calculus of gallbladder without cholecystitis without obstruction: Secondary | ICD-10-CM | POA: Diagnosis not present

## 2015-12-19 DIAGNOSIS — Z79899 Other long term (current) drug therapy: Secondary | ICD-10-CM | POA: Diagnosis not present

## 2015-12-19 DIAGNOSIS — Z90710 Acquired absence of both cervix and uterus: Secondary | ICD-10-CM | POA: Diagnosis not present

## 2015-12-19 DIAGNOSIS — R1031 Right lower quadrant pain: Secondary | ICD-10-CM | POA: Diagnosis not present

## 2015-12-19 DIAGNOSIS — M2550 Pain in unspecified joint: Secondary | ICD-10-CM | POA: Diagnosis not present

## 2015-12-19 DIAGNOSIS — Z881 Allergy status to other antibiotic agents status: Secondary | ICD-10-CM | POA: Diagnosis not present

## 2015-12-19 DIAGNOSIS — Z880 Allergy status to penicillin: Secondary | ICD-10-CM | POA: Diagnosis not present

## 2015-12-19 DIAGNOSIS — Z9089 Acquired absence of other organs: Secondary | ICD-10-CM | POA: Diagnosis not present

## 2015-12-19 DIAGNOSIS — E039 Hypothyroidism, unspecified: Secondary | ICD-10-CM | POA: Diagnosis not present

## 2015-12-19 DIAGNOSIS — Z7401 Bed confinement status: Secondary | ICD-10-CM | POA: Diagnosis not present

## 2015-12-19 DIAGNOSIS — R000 Tachycardia, unspecified: Secondary | ICD-10-CM | POA: Diagnosis not present

## 2015-12-19 DIAGNOSIS — E785 Hyperlipidemia, unspecified: Secondary | ICD-10-CM | POA: Diagnosis not present

## 2015-12-19 DIAGNOSIS — R112 Nausea with vomiting, unspecified: Secondary | ICD-10-CM | POA: Diagnosis not present

## 2015-12-19 DIAGNOSIS — Z91040 Latex allergy status: Secondary | ICD-10-CM | POA: Diagnosis not present

## 2015-12-19 DIAGNOSIS — F17200 Nicotine dependence, unspecified, uncomplicated: Secondary | ICD-10-CM | POA: Diagnosis not present

## 2015-12-19 DIAGNOSIS — D473 Essential (hemorrhagic) thrombocythemia: Secondary | ICD-10-CM | POA: Diagnosis not present

## 2015-12-19 DIAGNOSIS — F418 Other specified anxiety disorders: Secondary | ICD-10-CM | POA: Diagnosis not present

## 2015-12-19 DIAGNOSIS — M62838 Other muscle spasm: Secondary | ICD-10-CM | POA: Diagnosis not present

## 2015-12-19 DIAGNOSIS — F419 Anxiety disorder, unspecified: Secondary | ICD-10-CM | POA: Diagnosis not present

## 2015-12-19 DIAGNOSIS — R110 Nausea: Secondary | ICD-10-CM | POA: Diagnosis not present

## 2015-12-19 DIAGNOSIS — I824Z2 Acute embolism and thrombosis of unspecified deep veins of left distal lower extremity: Secondary | ICD-10-CM | POA: Diagnosis not present

## 2015-12-19 DIAGNOSIS — M5412 Radiculopathy, cervical region: Secondary | ICD-10-CM | POA: Diagnosis not present

## 2015-12-19 DIAGNOSIS — M961 Postlaminectomy syndrome, not elsewhere classified: Secondary | ICD-10-CM | POA: Diagnosis not present

## 2015-12-20 DIAGNOSIS — G8220 Paraplegia, unspecified: Secondary | ICD-10-CM | POA: Diagnosis not present

## 2015-12-20 DIAGNOSIS — I824Z2 Acute embolism and thrombosis of unspecified deep veins of left distal lower extremity: Secondary | ICD-10-CM | POA: Diagnosis not present

## 2015-12-20 DIAGNOSIS — M5412 Radiculopathy, cervical region: Secondary | ICD-10-CM | POA: Diagnosis not present

## 2015-12-20 DIAGNOSIS — M961 Postlaminectomy syndrome, not elsewhere classified: Secondary | ICD-10-CM | POA: Diagnosis not present

## 2015-12-20 DIAGNOSIS — G894 Chronic pain syndrome: Secondary | ICD-10-CM | POA: Diagnosis not present

## 2015-12-20 DIAGNOSIS — M62838 Other muscle spasm: Secondary | ICD-10-CM | POA: Diagnosis not present

## 2015-12-24 DIAGNOSIS — G8220 Paraplegia, unspecified: Secondary | ICD-10-CM | POA: Diagnosis not present

## 2015-12-24 DIAGNOSIS — M62838 Other muscle spasm: Secondary | ICD-10-CM | POA: Diagnosis not present

## 2015-12-24 DIAGNOSIS — M5412 Radiculopathy, cervical region: Secondary | ICD-10-CM | POA: Diagnosis not present

## 2015-12-24 DIAGNOSIS — I824Z2 Acute embolism and thrombosis of unspecified deep veins of left distal lower extremity: Secondary | ICD-10-CM | POA: Diagnosis not present

## 2015-12-24 DIAGNOSIS — M961 Postlaminectomy syndrome, not elsewhere classified: Secondary | ICD-10-CM | POA: Diagnosis not present

## 2015-12-24 DIAGNOSIS — G894 Chronic pain syndrome: Secondary | ICD-10-CM | POA: Diagnosis not present

## 2015-12-25 DIAGNOSIS — M5412 Radiculopathy, cervical region: Secondary | ICD-10-CM | POA: Diagnosis not present

## 2015-12-25 DIAGNOSIS — M62838 Other muscle spasm: Secondary | ICD-10-CM | POA: Diagnosis not present

## 2015-12-25 DIAGNOSIS — G8220 Paraplegia, unspecified: Secondary | ICD-10-CM | POA: Diagnosis not present

## 2015-12-25 DIAGNOSIS — M961 Postlaminectomy syndrome, not elsewhere classified: Secondary | ICD-10-CM | POA: Diagnosis not present

## 2015-12-25 DIAGNOSIS — I824Z2 Acute embolism and thrombosis of unspecified deep veins of left distal lower extremity: Secondary | ICD-10-CM | POA: Diagnosis not present

## 2015-12-25 DIAGNOSIS — G894 Chronic pain syndrome: Secondary | ICD-10-CM | POA: Diagnosis not present

## 2015-12-26 DIAGNOSIS — G8220 Paraplegia, unspecified: Secondary | ICD-10-CM | POA: Diagnosis not present

## 2015-12-26 DIAGNOSIS — I824Z2 Acute embolism and thrombosis of unspecified deep veins of left distal lower extremity: Secondary | ICD-10-CM | POA: Diagnosis not present

## 2015-12-26 DIAGNOSIS — M5412 Radiculopathy, cervical region: Secondary | ICD-10-CM | POA: Diagnosis not present

## 2015-12-26 DIAGNOSIS — M961 Postlaminectomy syndrome, not elsewhere classified: Secondary | ICD-10-CM | POA: Diagnosis not present

## 2015-12-26 DIAGNOSIS — G894 Chronic pain syndrome: Secondary | ICD-10-CM | POA: Diagnosis not present

## 2015-12-26 DIAGNOSIS — M62838 Other muscle spasm: Secondary | ICD-10-CM | POA: Diagnosis not present

## 2015-12-27 DIAGNOSIS — E039 Hypothyroidism, unspecified: Secondary | ICD-10-CM | POA: Diagnosis not present

## 2015-12-27 DIAGNOSIS — R112 Nausea with vomiting, unspecified: Secondary | ICD-10-CM | POA: Diagnosis not present

## 2015-12-27 DIAGNOSIS — R109 Unspecified abdominal pain: Secondary | ICD-10-CM | POA: Diagnosis not present

## 2015-12-27 DIAGNOSIS — K219 Gastro-esophageal reflux disease without esophagitis: Secondary | ICD-10-CM | POA: Diagnosis not present

## 2015-12-31 DIAGNOSIS — M5412 Radiculopathy, cervical region: Secondary | ICD-10-CM | POA: Diagnosis not present

## 2015-12-31 DIAGNOSIS — G8220 Paraplegia, unspecified: Secondary | ICD-10-CM | POA: Diagnosis not present

## 2015-12-31 DIAGNOSIS — G894 Chronic pain syndrome: Secondary | ICD-10-CM | POA: Diagnosis not present

## 2015-12-31 DIAGNOSIS — M62838 Other muscle spasm: Secondary | ICD-10-CM | POA: Diagnosis not present

## 2015-12-31 DIAGNOSIS — M961 Postlaminectomy syndrome, not elsewhere classified: Secondary | ICD-10-CM | POA: Diagnosis not present

## 2015-12-31 DIAGNOSIS — I824Z2 Acute embolism and thrombosis of unspecified deep veins of left distal lower extremity: Secondary | ICD-10-CM | POA: Diagnosis not present

## 2016-01-01 DIAGNOSIS — M62838 Other muscle spasm: Secondary | ICD-10-CM | POA: Diagnosis not present

## 2016-01-01 DIAGNOSIS — I824Z2 Acute embolism and thrombosis of unspecified deep veins of left distal lower extremity: Secondary | ICD-10-CM | POA: Diagnosis not present

## 2016-01-01 DIAGNOSIS — G894 Chronic pain syndrome: Secondary | ICD-10-CM | POA: Diagnosis not present

## 2016-01-01 DIAGNOSIS — M961 Postlaminectomy syndrome, not elsewhere classified: Secondary | ICD-10-CM | POA: Diagnosis not present

## 2016-01-01 DIAGNOSIS — G8220 Paraplegia, unspecified: Secondary | ICD-10-CM | POA: Diagnosis not present

## 2016-01-01 DIAGNOSIS — R109 Unspecified abdominal pain: Secondary | ICD-10-CM | POA: Diagnosis not present

## 2016-01-01 DIAGNOSIS — M4646 Discitis, unspecified, lumbar region: Secondary | ICD-10-CM | POA: Diagnosis not present

## 2016-01-01 DIAGNOSIS — M5412 Radiculopathy, cervical region: Secondary | ICD-10-CM | POA: Diagnosis not present

## 2016-01-01 DIAGNOSIS — M4316 Spondylolisthesis, lumbar region: Secondary | ICD-10-CM | POA: Diagnosis not present

## 2016-01-01 DIAGNOSIS — K760 Fatty (change of) liver, not elsewhere classified: Secondary | ICD-10-CM | POA: Diagnosis not present

## 2016-01-03 DIAGNOSIS — M5412 Radiculopathy, cervical region: Secondary | ICD-10-CM | POA: Diagnosis not present

## 2016-01-03 DIAGNOSIS — M62838 Other muscle spasm: Secondary | ICD-10-CM | POA: Diagnosis not present

## 2016-01-03 DIAGNOSIS — M961 Postlaminectomy syndrome, not elsewhere classified: Secondary | ICD-10-CM | POA: Diagnosis not present

## 2016-01-03 DIAGNOSIS — I824Z2 Acute embolism and thrombosis of unspecified deep veins of left distal lower extremity: Secondary | ICD-10-CM | POA: Diagnosis not present

## 2016-01-03 DIAGNOSIS — G8220 Paraplegia, unspecified: Secondary | ICD-10-CM | POA: Diagnosis not present

## 2016-01-03 DIAGNOSIS — G894 Chronic pain syndrome: Secondary | ICD-10-CM | POA: Diagnosis not present

## 2016-01-06 DIAGNOSIS — I824Z2 Acute embolism and thrombosis of unspecified deep veins of left distal lower extremity: Secondary | ICD-10-CM | POA: Diagnosis not present

## 2016-01-06 DIAGNOSIS — G8220 Paraplegia, unspecified: Secondary | ICD-10-CM | POA: Diagnosis not present

## 2016-01-06 DIAGNOSIS — M961 Postlaminectomy syndrome, not elsewhere classified: Secondary | ICD-10-CM | POA: Diagnosis not present

## 2016-01-06 DIAGNOSIS — M62838 Other muscle spasm: Secondary | ICD-10-CM | POA: Diagnosis not present

## 2016-01-06 DIAGNOSIS — G894 Chronic pain syndrome: Secondary | ICD-10-CM | POA: Diagnosis not present

## 2016-01-06 DIAGNOSIS — M5412 Radiculopathy, cervical region: Secondary | ICD-10-CM | POA: Diagnosis not present

## 2016-01-07 DIAGNOSIS — M62838 Other muscle spasm: Secondary | ICD-10-CM | POA: Diagnosis not present

## 2016-01-07 DIAGNOSIS — I824Z2 Acute embolism and thrombosis of unspecified deep veins of left distal lower extremity: Secondary | ICD-10-CM | POA: Diagnosis not present

## 2016-01-07 DIAGNOSIS — G894 Chronic pain syndrome: Secondary | ICD-10-CM | POA: Diagnosis not present

## 2016-01-07 DIAGNOSIS — M5412 Radiculopathy, cervical region: Secondary | ICD-10-CM | POA: Diagnosis not present

## 2016-01-07 DIAGNOSIS — M961 Postlaminectomy syndrome, not elsewhere classified: Secondary | ICD-10-CM | POA: Diagnosis not present

## 2016-01-07 DIAGNOSIS — G8220 Paraplegia, unspecified: Secondary | ICD-10-CM | POA: Diagnosis not present

## 2016-01-08 DIAGNOSIS — G8220 Paraplegia, unspecified: Secondary | ICD-10-CM | POA: Diagnosis not present

## 2016-01-08 DIAGNOSIS — M5412 Radiculopathy, cervical region: Secondary | ICD-10-CM | POA: Diagnosis not present

## 2016-01-08 DIAGNOSIS — G894 Chronic pain syndrome: Secondary | ICD-10-CM | POA: Diagnosis not present

## 2016-01-08 DIAGNOSIS — M961 Postlaminectomy syndrome, not elsewhere classified: Secondary | ICD-10-CM | POA: Diagnosis not present

## 2016-01-08 DIAGNOSIS — I824Z2 Acute embolism and thrombosis of unspecified deep veins of left distal lower extremity: Secondary | ICD-10-CM | POA: Diagnosis not present

## 2016-01-08 DIAGNOSIS — M62838 Other muscle spasm: Secondary | ICD-10-CM | POA: Diagnosis not present

## 2016-01-10 DIAGNOSIS — M9973 Connective tissue and disc stenosis of intervertebral foramina of lumbar region: Secondary | ICD-10-CM | POA: Diagnosis not present

## 2016-01-10 DIAGNOSIS — M4646 Discitis, unspecified, lumbar region: Secondary | ICD-10-CM | POA: Diagnosis not present

## 2016-01-10 DIAGNOSIS — M48061 Spinal stenosis, lumbar region without neurogenic claudication: Secondary | ICD-10-CM | POA: Diagnosis not present

## 2016-01-10 DIAGNOSIS — M4856XA Collapsed vertebra, not elsewhere classified, lumbar region, initial encounter for fracture: Secondary | ICD-10-CM | POA: Diagnosis not present

## 2016-01-13 DIAGNOSIS — G8220 Paraplegia, unspecified: Secondary | ICD-10-CM | POA: Diagnosis not present

## 2016-01-13 DIAGNOSIS — I824Z2 Acute embolism and thrombosis of unspecified deep veins of left distal lower extremity: Secondary | ICD-10-CM | POA: Diagnosis not present

## 2016-01-13 DIAGNOSIS — G894 Chronic pain syndrome: Secondary | ICD-10-CM | POA: Diagnosis not present

## 2016-01-13 DIAGNOSIS — M961 Postlaminectomy syndrome, not elsewhere classified: Secondary | ICD-10-CM | POA: Diagnosis not present

## 2016-01-13 DIAGNOSIS — M62838 Other muscle spasm: Secondary | ICD-10-CM | POA: Diagnosis not present

## 2016-01-13 DIAGNOSIS — M5412 Radiculopathy, cervical region: Secondary | ICD-10-CM | POA: Diagnosis not present

## 2016-01-14 DIAGNOSIS — M5412 Radiculopathy, cervical region: Secondary | ICD-10-CM | POA: Diagnosis not present

## 2016-01-14 DIAGNOSIS — M961 Postlaminectomy syndrome, not elsewhere classified: Secondary | ICD-10-CM | POA: Diagnosis not present

## 2016-01-14 DIAGNOSIS — I824Z2 Acute embolism and thrombosis of unspecified deep veins of left distal lower extremity: Secondary | ICD-10-CM | POA: Diagnosis not present

## 2016-01-14 DIAGNOSIS — G894 Chronic pain syndrome: Secondary | ICD-10-CM | POA: Diagnosis not present

## 2016-01-14 DIAGNOSIS — G8220 Paraplegia, unspecified: Secondary | ICD-10-CM | POA: Diagnosis not present

## 2016-01-14 DIAGNOSIS — M62838 Other muscle spasm: Secondary | ICD-10-CM | POA: Diagnosis not present

## 2016-01-15 DIAGNOSIS — M62838 Other muscle spasm: Secondary | ICD-10-CM | POA: Diagnosis not present

## 2016-01-15 DIAGNOSIS — I824Z2 Acute embolism and thrombosis of unspecified deep veins of left distal lower extremity: Secondary | ICD-10-CM | POA: Diagnosis not present

## 2016-01-15 DIAGNOSIS — M961 Postlaminectomy syndrome, not elsewhere classified: Secondary | ICD-10-CM | POA: Diagnosis not present

## 2016-01-15 DIAGNOSIS — G894 Chronic pain syndrome: Secondary | ICD-10-CM | POA: Diagnosis not present

## 2016-01-15 DIAGNOSIS — G8220 Paraplegia, unspecified: Secondary | ICD-10-CM | POA: Diagnosis not present

## 2016-01-15 DIAGNOSIS — M5412 Radiculopathy, cervical region: Secondary | ICD-10-CM | POA: Diagnosis not present

## 2016-01-16 DIAGNOSIS — G894 Chronic pain syndrome: Secondary | ICD-10-CM | POA: Diagnosis not present

## 2016-01-16 DIAGNOSIS — M961 Postlaminectomy syndrome, not elsewhere classified: Secondary | ICD-10-CM | POA: Diagnosis not present

## 2016-01-16 DIAGNOSIS — M5412 Radiculopathy, cervical region: Secondary | ICD-10-CM | POA: Diagnosis not present

## 2016-01-16 DIAGNOSIS — I824Z2 Acute embolism and thrombosis of unspecified deep veins of left distal lower extremity: Secondary | ICD-10-CM | POA: Diagnosis not present

## 2016-01-16 DIAGNOSIS — G8220 Paraplegia, unspecified: Secondary | ICD-10-CM | POA: Diagnosis not present

## 2016-01-16 DIAGNOSIS — M62838 Other muscle spasm: Secondary | ICD-10-CM | POA: Diagnosis not present

## 2016-01-17 DIAGNOSIS — R609 Edema, unspecified: Secondary | ICD-10-CM | POA: Diagnosis not present

## 2016-01-17 DIAGNOSIS — M79605 Pain in left leg: Secondary | ICD-10-CM | POA: Diagnosis not present

## 2016-01-20 DIAGNOSIS — I824Z2 Acute embolism and thrombosis of unspecified deep veins of left distal lower extremity: Secondary | ICD-10-CM | POA: Diagnosis not present

## 2016-01-20 DIAGNOSIS — M62838 Other muscle spasm: Secondary | ICD-10-CM | POA: Diagnosis not present

## 2016-01-20 DIAGNOSIS — G894 Chronic pain syndrome: Secondary | ICD-10-CM | POA: Diagnosis not present

## 2016-01-20 DIAGNOSIS — M961 Postlaminectomy syndrome, not elsewhere classified: Secondary | ICD-10-CM | POA: Diagnosis not present

## 2016-01-20 DIAGNOSIS — M5412 Radiculopathy, cervical region: Secondary | ICD-10-CM | POA: Diagnosis not present

## 2016-01-20 DIAGNOSIS — E876 Hypokalemia: Secondary | ICD-10-CM | POA: Diagnosis not present

## 2016-01-20 DIAGNOSIS — G8220 Paraplegia, unspecified: Secondary | ICD-10-CM | POA: Diagnosis not present

## 2016-01-22 DIAGNOSIS — I824Z2 Acute embolism and thrombosis of unspecified deep veins of left distal lower extremity: Secondary | ICD-10-CM | POA: Diagnosis not present

## 2016-01-22 DIAGNOSIS — M961 Postlaminectomy syndrome, not elsewhere classified: Secondary | ICD-10-CM | POA: Diagnosis not present

## 2016-01-22 DIAGNOSIS — G894 Chronic pain syndrome: Secondary | ICD-10-CM | POA: Diagnosis not present

## 2016-01-22 DIAGNOSIS — M62838 Other muscle spasm: Secondary | ICD-10-CM | POA: Diagnosis not present

## 2016-01-22 DIAGNOSIS — M5412 Radiculopathy, cervical region: Secondary | ICD-10-CM | POA: Diagnosis not present

## 2016-01-22 DIAGNOSIS — G8220 Paraplegia, unspecified: Secondary | ICD-10-CM | POA: Diagnosis not present

## 2016-01-27 DIAGNOSIS — M62838 Other muscle spasm: Secondary | ICD-10-CM | POA: Diagnosis not present

## 2016-01-27 DIAGNOSIS — G894 Chronic pain syndrome: Secondary | ICD-10-CM | POA: Diagnosis not present

## 2016-01-27 DIAGNOSIS — M961 Postlaminectomy syndrome, not elsewhere classified: Secondary | ICD-10-CM | POA: Diagnosis not present

## 2016-01-27 DIAGNOSIS — G8220 Paraplegia, unspecified: Secondary | ICD-10-CM | POA: Diagnosis not present

## 2016-01-27 DIAGNOSIS — I824Z2 Acute embolism and thrombosis of unspecified deep veins of left distal lower extremity: Secondary | ICD-10-CM | POA: Diagnosis not present

## 2016-01-27 DIAGNOSIS — M5412 Radiculopathy, cervical region: Secondary | ICD-10-CM | POA: Diagnosis not present

## 2016-01-28 DIAGNOSIS — M961 Postlaminectomy syndrome, not elsewhere classified: Secondary | ICD-10-CM | POA: Diagnosis not present

## 2016-01-28 DIAGNOSIS — I82422 Acute embolism and thrombosis of left iliac vein: Secondary | ICD-10-CM | POA: Diagnosis not present

## 2016-01-28 DIAGNOSIS — M62838 Other muscle spasm: Secondary | ICD-10-CM | POA: Diagnosis not present

## 2016-01-28 DIAGNOSIS — I824Z2 Acute embolism and thrombosis of unspecified deep veins of left distal lower extremity: Secondary | ICD-10-CM | POA: Diagnosis not present

## 2016-01-28 DIAGNOSIS — G8220 Paraplegia, unspecified: Secondary | ICD-10-CM | POA: Diagnosis not present

## 2016-01-28 DIAGNOSIS — M5412 Radiculopathy, cervical region: Secondary | ICD-10-CM | POA: Diagnosis not present

## 2016-01-28 DIAGNOSIS — G894 Chronic pain syndrome: Secondary | ICD-10-CM | POA: Diagnosis not present

## 2016-01-29 DIAGNOSIS — I824Z2 Acute embolism and thrombosis of unspecified deep veins of left distal lower extremity: Secondary | ICD-10-CM | POA: Diagnosis not present

## 2016-01-29 DIAGNOSIS — M961 Postlaminectomy syndrome, not elsewhere classified: Secondary | ICD-10-CM | POA: Diagnosis not present

## 2016-01-29 DIAGNOSIS — Z79899 Other long term (current) drug therapy: Secondary | ICD-10-CM | POA: Diagnosis not present

## 2016-01-29 DIAGNOSIS — G8220 Paraplegia, unspecified: Secondary | ICD-10-CM | POA: Diagnosis not present

## 2016-01-29 DIAGNOSIS — M8668 Other chronic osteomyelitis, other site: Secondary | ICD-10-CM | POA: Diagnosis not present

## 2016-01-29 DIAGNOSIS — I82422 Acute embolism and thrombosis of left iliac vein: Secondary | ICD-10-CM | POA: Diagnosis not present

## 2016-01-29 DIAGNOSIS — G894 Chronic pain syndrome: Secondary | ICD-10-CM | POA: Diagnosis not present

## 2016-01-29 DIAGNOSIS — M5412 Radiculopathy, cervical region: Secondary | ICD-10-CM | POA: Diagnosis not present

## 2016-01-29 DIAGNOSIS — R945 Abnormal results of liver function studies: Secondary | ICD-10-CM | POA: Diagnosis not present

## 2016-01-29 DIAGNOSIS — M62838 Other muscle spasm: Secondary | ICD-10-CM | POA: Diagnosis not present

## 2016-01-31 DIAGNOSIS — R600 Localized edema: Secondary | ICD-10-CM | POA: Diagnosis not present

## 2016-01-31 DIAGNOSIS — R609 Edema, unspecified: Secondary | ICD-10-CM | POA: Diagnosis not present

## 2016-02-04 DIAGNOSIS — I87002 Postthrombotic syndrome without complications of left lower extremity: Secondary | ICD-10-CM | POA: Diagnosis not present

## 2016-02-04 DIAGNOSIS — I82502 Chronic embolism and thrombosis of unspecified deep veins of left lower extremity: Secondary | ICD-10-CM | POA: Diagnosis not present

## 2016-02-05 DIAGNOSIS — I824Z2 Acute embolism and thrombosis of unspecified deep veins of left distal lower extremity: Secondary | ICD-10-CM | POA: Diagnosis not present

## 2016-02-05 DIAGNOSIS — M961 Postlaminectomy syndrome, not elsewhere classified: Secondary | ICD-10-CM | POA: Diagnosis not present

## 2016-02-05 DIAGNOSIS — G8220 Paraplegia, unspecified: Secondary | ICD-10-CM | POA: Diagnosis not present

## 2016-02-05 DIAGNOSIS — M48062 Spinal stenosis, lumbar region with neurogenic claudication: Secondary | ICD-10-CM | POA: Diagnosis not present

## 2016-02-05 DIAGNOSIS — I82422 Acute embolism and thrombosis of left iliac vein: Secondary | ICD-10-CM | POA: Diagnosis not present

## 2016-02-05 DIAGNOSIS — M62838 Other muscle spasm: Secondary | ICD-10-CM | POA: Diagnosis not present

## 2016-02-05 DIAGNOSIS — M4646 Discitis, unspecified, lumbar region: Secondary | ICD-10-CM | POA: Diagnosis not present

## 2016-02-05 DIAGNOSIS — M5412 Radiculopathy, cervical region: Secondary | ICD-10-CM | POA: Diagnosis not present

## 2016-02-05 DIAGNOSIS — G894 Chronic pain syndrome: Secondary | ICD-10-CM | POA: Diagnosis not present

## 2016-02-06 DIAGNOSIS — M48062 Spinal stenosis, lumbar region with neurogenic claudication: Secondary | ICD-10-CM | POA: Diagnosis not present

## 2016-02-06 DIAGNOSIS — J90 Pleural effusion, not elsewhere classified: Secondary | ICD-10-CM | POA: Diagnosis not present

## 2016-02-11 DIAGNOSIS — I82502 Chronic embolism and thrombosis of unspecified deep veins of left lower extremity: Secondary | ICD-10-CM | POA: Diagnosis not present

## 2016-02-11 DIAGNOSIS — M7989 Other specified soft tissue disorders: Secondary | ICD-10-CM | POA: Diagnosis not present

## 2016-02-12 DIAGNOSIS — M961 Postlaminectomy syndrome, not elsewhere classified: Secondary | ICD-10-CM | POA: Diagnosis not present

## 2016-02-12 DIAGNOSIS — G894 Chronic pain syndrome: Secondary | ICD-10-CM | POA: Diagnosis not present

## 2016-02-12 DIAGNOSIS — M5412 Radiculopathy, cervical region: Secondary | ICD-10-CM | POA: Diagnosis not present

## 2016-02-12 DIAGNOSIS — M62838 Other muscle spasm: Secondary | ICD-10-CM | POA: Diagnosis not present

## 2016-02-12 DIAGNOSIS — I824Z2 Acute embolism and thrombosis of unspecified deep veins of left distal lower extremity: Secondary | ICD-10-CM | POA: Diagnosis not present

## 2016-02-12 DIAGNOSIS — G8220 Paraplegia, unspecified: Secondary | ICD-10-CM | POA: Diagnosis not present

## 2016-02-13 DIAGNOSIS — M961 Postlaminectomy syndrome, not elsewhere classified: Secondary | ICD-10-CM | POA: Diagnosis not present

## 2016-02-13 DIAGNOSIS — I824Z2 Acute embolism and thrombosis of unspecified deep veins of left distal lower extremity: Secondary | ICD-10-CM | POA: Diagnosis not present

## 2016-02-13 DIAGNOSIS — G894 Chronic pain syndrome: Secondary | ICD-10-CM | POA: Diagnosis not present

## 2016-02-13 DIAGNOSIS — M62838 Other muscle spasm: Secondary | ICD-10-CM | POA: Diagnosis not present

## 2016-02-13 DIAGNOSIS — G8220 Paraplegia, unspecified: Secondary | ICD-10-CM | POA: Diagnosis not present

## 2016-02-13 DIAGNOSIS — M5412 Radiculopathy, cervical region: Secondary | ICD-10-CM | POA: Diagnosis not present

## 2016-02-14 DIAGNOSIS — I82502 Chronic embolism and thrombosis of unspecified deep veins of left lower extremity: Secondary | ICD-10-CM | POA: Diagnosis not present

## 2016-02-14 DIAGNOSIS — M4646 Discitis, unspecified, lumbar region: Secondary | ICD-10-CM | POA: Diagnosis not present

## 2016-02-14 DIAGNOSIS — I82422 Acute embolism and thrombosis of left iliac vein: Secondary | ICD-10-CM | POA: Diagnosis not present

## 2016-02-14 DIAGNOSIS — C3490 Malignant neoplasm of unspecified part of unspecified bronchus or lung: Secondary | ICD-10-CM | POA: Diagnosis not present

## 2016-02-17 DIAGNOSIS — R7301 Impaired fasting glucose: Secondary | ICD-10-CM | POA: Diagnosis not present

## 2016-02-17 DIAGNOSIS — N390 Urinary tract infection, site not specified: Secondary | ICD-10-CM | POA: Diagnosis not present

## 2016-02-17 DIAGNOSIS — Z86718 Personal history of other venous thrombosis and embolism: Secondary | ICD-10-CM | POA: Diagnosis not present

## 2016-02-17 DIAGNOSIS — R609 Edema, unspecified: Secondary | ICD-10-CM | POA: Diagnosis not present

## 2016-02-17 DIAGNOSIS — E039 Hypothyroidism, unspecified: Secondary | ICD-10-CM | POA: Diagnosis not present

## 2016-02-18 DIAGNOSIS — I824Y2 Acute embolism and thrombosis of unspecified deep veins of left proximal lower extremity: Secondary | ICD-10-CM | POA: Diagnosis not present

## 2016-02-18 DIAGNOSIS — I871 Compression of vein: Secondary | ICD-10-CM | POA: Diagnosis not present

## 2016-02-19 DIAGNOSIS — G894 Chronic pain syndrome: Secondary | ICD-10-CM | POA: Diagnosis not present

## 2016-02-19 DIAGNOSIS — G8220 Paraplegia, unspecified: Secondary | ICD-10-CM | POA: Diagnosis not present

## 2016-02-19 DIAGNOSIS — M62838 Other muscle spasm: Secondary | ICD-10-CM | POA: Diagnosis not present

## 2016-02-19 DIAGNOSIS — M5412 Radiculopathy, cervical region: Secondary | ICD-10-CM | POA: Diagnosis not present

## 2016-02-19 DIAGNOSIS — I824Z2 Acute embolism and thrombosis of unspecified deep veins of left distal lower extremity: Secondary | ICD-10-CM | POA: Diagnosis not present

## 2016-02-19 DIAGNOSIS — M961 Postlaminectomy syndrome, not elsewhere classified: Secondary | ICD-10-CM | POA: Diagnosis not present

## 2016-02-27 DIAGNOSIS — M62838 Other muscle spasm: Secondary | ICD-10-CM | POA: Diagnosis not present

## 2016-02-27 DIAGNOSIS — G894 Chronic pain syndrome: Secondary | ICD-10-CM | POA: Diagnosis not present

## 2016-02-27 DIAGNOSIS — M5412 Radiculopathy, cervical region: Secondary | ICD-10-CM | POA: Diagnosis not present

## 2016-02-27 DIAGNOSIS — M961 Postlaminectomy syndrome, not elsewhere classified: Secondary | ICD-10-CM | POA: Diagnosis not present

## 2016-02-27 DIAGNOSIS — N390 Urinary tract infection, site not specified: Secondary | ICD-10-CM | POA: Diagnosis not present

## 2016-02-27 DIAGNOSIS — I824Z2 Acute embolism and thrombosis of unspecified deep veins of left distal lower extremity: Secondary | ICD-10-CM | POA: Diagnosis not present

## 2016-02-27 DIAGNOSIS — G8220 Paraplegia, unspecified: Secondary | ICD-10-CM | POA: Diagnosis not present

## 2016-03-12 DIAGNOSIS — I824Z2 Acute embolism and thrombosis of unspecified deep veins of left distal lower extremity: Secondary | ICD-10-CM | POA: Diagnosis not present

## 2016-03-12 DIAGNOSIS — G894 Chronic pain syndrome: Secondary | ICD-10-CM | POA: Diagnosis not present

## 2016-03-12 DIAGNOSIS — I82422 Acute embolism and thrombosis of left iliac vein: Secondary | ICD-10-CM | POA: Diagnosis not present

## 2016-03-12 DIAGNOSIS — E43 Unspecified severe protein-calorie malnutrition: Secondary | ICD-10-CM | POA: Diagnosis not present

## 2016-03-12 DIAGNOSIS — E039 Hypothyroidism, unspecified: Secondary | ICD-10-CM | POA: Diagnosis not present

## 2016-03-12 DIAGNOSIS — M5412 Radiculopathy, cervical region: Secondary | ICD-10-CM | POA: Diagnosis not present

## 2016-03-12 DIAGNOSIS — G8220 Paraplegia, unspecified: Secondary | ICD-10-CM | POA: Diagnosis not present

## 2016-03-12 DIAGNOSIS — M62838 Other muscle spasm: Secondary | ICD-10-CM | POA: Diagnosis not present

## 2016-03-12 DIAGNOSIS — M961 Postlaminectomy syndrome, not elsewhere classified: Secondary | ICD-10-CM | POA: Diagnosis not present

## 2016-03-16 DIAGNOSIS — Z01812 Encounter for preprocedural laboratory examination: Secondary | ICD-10-CM | POA: Diagnosis not present

## 2016-03-16 DIAGNOSIS — Z7951 Long term (current) use of inhaled steroids: Secondary | ICD-10-CM | POA: Diagnosis not present

## 2016-03-16 DIAGNOSIS — I82422 Acute embolism and thrombosis of left iliac vein: Secondary | ICD-10-CM | POA: Diagnosis not present

## 2016-03-16 DIAGNOSIS — Z9884 Bariatric surgery status: Secondary | ICD-10-CM | POA: Diagnosis not present

## 2016-03-16 DIAGNOSIS — Z79899 Other long term (current) drug therapy: Secondary | ICD-10-CM | POA: Diagnosis not present

## 2016-03-16 DIAGNOSIS — I878 Other specified disorders of veins: Secondary | ICD-10-CM | POA: Diagnosis not present

## 2016-03-16 DIAGNOSIS — K219 Gastro-esophageal reflux disease without esophagitis: Secondary | ICD-10-CM | POA: Diagnosis not present

## 2016-03-16 DIAGNOSIS — E039 Hypothyroidism, unspecified: Secondary | ICD-10-CM | POA: Diagnosis not present

## 2016-03-17 DIAGNOSIS — I824Z2 Acute embolism and thrombosis of unspecified deep veins of left distal lower extremity: Secondary | ICD-10-CM | POA: Diagnosis not present

## 2016-03-17 DIAGNOSIS — M5412 Radiculopathy, cervical region: Secondary | ICD-10-CM | POA: Diagnosis not present

## 2016-03-17 DIAGNOSIS — M961 Postlaminectomy syndrome, not elsewhere classified: Secondary | ICD-10-CM | POA: Diagnosis not present

## 2016-03-17 DIAGNOSIS — G894 Chronic pain syndrome: Secondary | ICD-10-CM | POA: Diagnosis not present

## 2016-03-20 DIAGNOSIS — G894 Chronic pain syndrome: Secondary | ICD-10-CM | POA: Diagnosis not present

## 2016-03-20 DIAGNOSIS — E039 Hypothyroidism, unspecified: Secondary | ICD-10-CM | POA: Diagnosis not present

## 2016-03-20 DIAGNOSIS — M5412 Radiculopathy, cervical region: Secondary | ICD-10-CM | POA: Diagnosis not present

## 2016-03-20 DIAGNOSIS — M62838 Other muscle spasm: Secondary | ICD-10-CM | POA: Diagnosis not present

## 2016-03-20 DIAGNOSIS — I824Z2 Acute embolism and thrombosis of unspecified deep veins of left distal lower extremity: Secondary | ICD-10-CM | POA: Diagnosis not present

## 2016-03-20 DIAGNOSIS — M961 Postlaminectomy syndrome, not elsewhere classified: Secondary | ICD-10-CM | POA: Diagnosis not present

## 2016-03-20 DIAGNOSIS — G8220 Paraplegia, unspecified: Secondary | ICD-10-CM | POA: Diagnosis not present

## 2016-03-27 DIAGNOSIS — M62838 Other muscle spasm: Secondary | ICD-10-CM | POA: Diagnosis not present

## 2016-03-27 DIAGNOSIS — I824Z2 Acute embolism and thrombosis of unspecified deep veins of left distal lower extremity: Secondary | ICD-10-CM | POA: Diagnosis not present

## 2016-03-27 DIAGNOSIS — M5412 Radiculopathy, cervical region: Secondary | ICD-10-CM | POA: Diagnosis not present

## 2016-03-27 DIAGNOSIS — G894 Chronic pain syndrome: Secondary | ICD-10-CM | POA: Diagnosis not present

## 2016-03-27 DIAGNOSIS — G8220 Paraplegia, unspecified: Secondary | ICD-10-CM | POA: Diagnosis not present

## 2016-03-27 DIAGNOSIS — M961 Postlaminectomy syndrome, not elsewhere classified: Secondary | ICD-10-CM | POA: Diagnosis not present

## 2016-04-22 DIAGNOSIS — Z79899 Other long term (current) drug therapy: Secondary | ICD-10-CM | POA: Diagnosis not present

## 2016-04-23 DIAGNOSIS — E039 Hypothyroidism, unspecified: Secondary | ICD-10-CM | POA: Diagnosis not present

## 2016-04-23 DIAGNOSIS — Z86718 Personal history of other venous thrombosis and embolism: Secondary | ICD-10-CM | POA: Diagnosis not present

## 2016-04-23 DIAGNOSIS — R531 Weakness: Secondary | ICD-10-CM | POA: Diagnosis not present

## 2016-04-23 DIAGNOSIS — E876 Hypokalemia: Secondary | ICD-10-CM | POA: Diagnosis not present

## 2016-05-14 DIAGNOSIS — I871 Compression of vein: Secondary | ICD-10-CM | POA: Diagnosis not present

## 2016-05-20 DIAGNOSIS — E039 Hypothyroidism, unspecified: Secondary | ICD-10-CM | POA: Diagnosis not present

## 2016-05-20 DIAGNOSIS — R350 Frequency of micturition: Secondary | ICD-10-CM | POA: Diagnosis not present

## 2016-05-20 DIAGNOSIS — Z79899 Other long term (current) drug therapy: Secondary | ICD-10-CM | POA: Diagnosis not present

## 2016-06-03 DIAGNOSIS — M9973 Connective tissue and disc stenosis of intervertebral foramina of lumbar region: Secondary | ICD-10-CM | POA: Diagnosis not present

## 2016-06-03 DIAGNOSIS — M961 Postlaminectomy syndrome, not elsewhere classified: Secondary | ICD-10-CM | POA: Diagnosis not present

## 2016-06-03 DIAGNOSIS — G8220 Paraplegia, unspecified: Secondary | ICD-10-CM | POA: Diagnosis not present

## 2016-06-03 DIAGNOSIS — G894 Chronic pain syndrome: Secondary | ICD-10-CM | POA: Diagnosis not present

## 2016-07-15 DIAGNOSIS — Z87891 Personal history of nicotine dependence: Secondary | ICD-10-CM | POA: Diagnosis not present

## 2016-07-15 DIAGNOSIS — Z79899 Other long term (current) drug therapy: Secondary | ICD-10-CM | POA: Diagnosis not present

## 2016-07-15 DIAGNOSIS — Z86718 Personal history of other venous thrombosis and embolism: Secondary | ICD-10-CM | POA: Diagnosis not present

## 2016-07-15 DIAGNOSIS — E039 Hypothyroidism, unspecified: Secondary | ICD-10-CM | POA: Diagnosis not present

## 2016-07-15 DIAGNOSIS — E559 Vitamin D deficiency, unspecified: Secondary | ICD-10-CM | POA: Diagnosis not present

## 2016-07-15 DIAGNOSIS — R609 Edema, unspecified: Secondary | ICD-10-CM | POA: Diagnosis not present

## 2016-07-15 DIAGNOSIS — R945 Abnormal results of liver function studies: Secondary | ICD-10-CM | POA: Diagnosis not present

## 2016-08-18 DIAGNOSIS — R1013 Epigastric pain: Secondary | ICD-10-CM | POA: Diagnosis not present

## 2016-08-18 DIAGNOSIS — Z9884 Bariatric surgery status: Secondary | ICD-10-CM | POA: Diagnosis not present

## 2016-08-18 DIAGNOSIS — R112 Nausea with vomiting, unspecified: Secondary | ICD-10-CM | POA: Diagnosis not present

## 2016-08-18 DIAGNOSIS — R1031 Right lower quadrant pain: Secondary | ICD-10-CM | POA: Diagnosis not present

## 2016-08-18 DIAGNOSIS — Z79899 Other long term (current) drug therapy: Secondary | ICD-10-CM | POA: Diagnosis not present

## 2016-08-21 DIAGNOSIS — Z9884 Bariatric surgery status: Secondary | ICD-10-CM | POA: Diagnosis not present

## 2016-08-21 DIAGNOSIS — R1013 Epigastric pain: Secondary | ICD-10-CM | POA: Diagnosis not present

## 2016-08-21 DIAGNOSIS — R160 Hepatomegaly, not elsewhere classified: Secondary | ICD-10-CM | POA: Diagnosis not present

## 2016-08-21 DIAGNOSIS — R1031 Right lower quadrant pain: Secondary | ICD-10-CM | POA: Diagnosis not present

## 2016-08-21 DIAGNOSIS — R112 Nausea with vomiting, unspecified: Secondary | ICD-10-CM | POA: Diagnosis not present

## 2016-08-21 DIAGNOSIS — K760 Fatty (change of) liver, not elsewhere classified: Secondary | ICD-10-CM | POA: Diagnosis not present

## 2016-08-25 DIAGNOSIS — G893 Neoplasm related pain (acute) (chronic): Secondary | ICD-10-CM | POA: Diagnosis present

## 2016-08-25 DIAGNOSIS — C779 Secondary and unspecified malignant neoplasm of lymph node, unspecified: Secondary | ICD-10-CM | POA: Diagnosis not present

## 2016-08-25 DIAGNOSIS — R112 Nausea with vomiting, unspecified: Secondary | ICD-10-CM | POA: Diagnosis not present

## 2016-08-25 DIAGNOSIS — C8198 Hodgkin lymphoma, unspecified, lymph nodes of multiple sites: Secondary | ICD-10-CM | POA: Diagnosis not present

## 2016-08-25 DIAGNOSIS — Z9884 Bariatric surgery status: Secondary | ICD-10-CM | POA: Diagnosis not present

## 2016-08-25 DIAGNOSIS — R32 Unspecified urinary incontinence: Secondary | ICD-10-CM | POA: Diagnosis present

## 2016-08-25 DIAGNOSIS — C50919 Malignant neoplasm of unspecified site of unspecified female breast: Secondary | ICD-10-CM | POA: Diagnosis not present

## 2016-08-25 DIAGNOSIS — G894 Chronic pain syndrome: Secondary | ICD-10-CM | POA: Diagnosis present

## 2016-08-25 DIAGNOSIS — Z9981 Dependence on supplemental oxygen: Secondary | ICD-10-CM | POA: Diagnosis not present

## 2016-08-25 DIAGNOSIS — Z66 Do not resuscitate: Secondary | ICD-10-CM | POA: Diagnosis not present

## 2016-08-25 DIAGNOSIS — Z6827 Body mass index (BMI) 27.0-27.9, adult: Secondary | ICD-10-CM | POA: Diagnosis not present

## 2016-08-25 DIAGNOSIS — F329 Major depressive disorder, single episode, unspecified: Secondary | ICD-10-CM | POA: Diagnosis present

## 2016-08-25 DIAGNOSIS — R634 Abnormal weight loss: Secondary | ICD-10-CM | POA: Diagnosis not present

## 2016-08-25 DIAGNOSIS — M21371 Foot drop, right foot: Secondary | ICD-10-CM | POA: Diagnosis present

## 2016-08-25 DIAGNOSIS — E871 Hypo-osmolality and hyponatremia: Secondary | ICD-10-CM | POA: Diagnosis present

## 2016-08-25 DIAGNOSIS — M21372 Foot drop, left foot: Secondary | ICD-10-CM | POA: Diagnosis present

## 2016-08-25 DIAGNOSIS — Z8541 Personal history of malignant neoplasm of cervix uteri: Secondary | ICD-10-CM | POA: Diagnosis not present

## 2016-08-25 DIAGNOSIS — R0781 Pleurodynia: Secondary | ICD-10-CM | POA: Diagnosis not present

## 2016-08-25 DIAGNOSIS — R911 Solitary pulmonary nodule: Secondary | ICD-10-CM | POA: Diagnosis not present

## 2016-08-25 DIAGNOSIS — E785 Hyperlipidemia, unspecified: Secondary | ICD-10-CM | POA: Diagnosis present

## 2016-08-25 DIAGNOSIS — E039 Hypothyroidism, unspecified: Secondary | ICD-10-CM | POA: Diagnosis not present

## 2016-08-25 DIAGNOSIS — E43 Unspecified severe protein-calorie malnutrition: Secondary | ICD-10-CM | POA: Diagnosis not present

## 2016-08-25 DIAGNOSIS — J918 Pleural effusion in other conditions classified elsewhere: Secondary | ICD-10-CM | POA: Diagnosis not present

## 2016-08-25 DIAGNOSIS — R0602 Shortness of breath: Secondary | ICD-10-CM | POA: Diagnosis not present

## 2016-08-25 DIAGNOSIS — G8929 Other chronic pain: Secondary | ICD-10-CM | POA: Diagnosis not present

## 2016-08-25 DIAGNOSIS — F419 Anxiety disorder, unspecified: Secondary | ICD-10-CM | POA: Diagnosis present

## 2016-08-25 DIAGNOSIS — Z466 Encounter for fitting and adjustment of urinary device: Secondary | ICD-10-CM | POA: Diagnosis not present

## 2016-08-25 DIAGNOSIS — K760 Fatty (change of) liver, not elsewhere classified: Secondary | ICD-10-CM | POA: Diagnosis not present

## 2016-08-25 DIAGNOSIS — Z8572 Personal history of non-Hodgkin lymphomas: Secondary | ICD-10-CM | POA: Diagnosis not present

## 2016-08-25 DIAGNOSIS — R918 Other nonspecific abnormal finding of lung field: Secondary | ICD-10-CM | POA: Diagnosis not present

## 2016-08-25 DIAGNOSIS — D649 Anemia, unspecified: Secondary | ICD-10-CM | POA: Diagnosis not present

## 2016-08-25 DIAGNOSIS — M2550 Pain in unspecified joint: Secondary | ICD-10-CM | POA: Diagnosis not present

## 2016-08-25 DIAGNOSIS — I10 Essential (primary) hypertension: Secondary | ICD-10-CM | POA: Diagnosis not present

## 2016-08-25 DIAGNOSIS — C7951 Secondary malignant neoplasm of bone: Secondary | ICD-10-CM | POA: Diagnosis not present

## 2016-08-25 DIAGNOSIS — J90 Pleural effusion, not elsewhere classified: Secondary | ICD-10-CM | POA: Diagnosis not present

## 2016-08-25 DIAGNOSIS — Z7401 Bed confinement status: Secondary | ICD-10-CM | POA: Diagnosis not present

## 2016-08-25 DIAGNOSIS — C7800 Secondary malignant neoplasm of unspecified lung: Secondary | ICD-10-CM | POA: Diagnosis not present

## 2016-08-25 DIAGNOSIS — C3490 Malignant neoplasm of unspecified part of unspecified bronchus or lung: Secondary | ICD-10-CM | POA: Diagnosis not present

## 2016-08-25 DIAGNOSIS — J9811 Atelectasis: Secondary | ICD-10-CM | POA: Diagnosis not present

## 2016-08-25 DIAGNOSIS — Z853 Personal history of malignant neoplasm of breast: Secondary | ICD-10-CM | POA: Diagnosis not present

## 2016-08-25 DIAGNOSIS — C8190 Hodgkin lymphoma, unspecified, unspecified site: Secondary | ICD-10-CM | POA: Diagnosis not present

## 2016-08-25 DIAGNOSIS — R64 Cachexia: Secondary | ICD-10-CM | POA: Diagnosis present

## 2016-08-25 DIAGNOSIS — K219 Gastro-esophageal reflux disease without esophagitis: Secondary | ICD-10-CM | POA: Diagnosis not present

## 2016-08-25 DIAGNOSIS — I82412 Acute embolism and thrombosis of left femoral vein: Secondary | ICD-10-CM | POA: Diagnosis not present

## 2016-08-25 DIAGNOSIS — R069 Unspecified abnormalities of breathing: Secondary | ICD-10-CM | POA: Diagnosis not present

## 2016-08-25 DIAGNOSIS — G8220 Paraplegia, unspecified: Secondary | ICD-10-CM | POA: Diagnosis present

## 2016-08-25 DIAGNOSIS — G43A1 Cyclical vomiting, intractable: Secondary | ICD-10-CM | POA: Diagnosis present

## 2016-08-25 DIAGNOSIS — M8588 Other specified disorders of bone density and structure, other site: Secondary | ICD-10-CM | POA: Diagnosis not present

## 2016-08-26 DIAGNOSIS — R0602 Shortness of breath: Secondary | ICD-10-CM | POA: Diagnosis not present

## 2016-09-30 DEATH — deceased
